# Patient Record
Sex: Female | Born: 1965 | Race: White | Hispanic: Yes | Marital: Married | Smoking: Never smoker
Health system: Southern US, Community
[De-identification: ages and names within clinical notes are randomized; demographics above are authoritative.]

## PROBLEM LIST (undated history)

## (undated) DIAGNOSIS — M25519 Pain in unspecified shoulder: Secondary | ICD-10-CM

## (undated) DIAGNOSIS — E119 Type 2 diabetes mellitus without complications: Secondary | ICD-10-CM

## (undated) DIAGNOSIS — F419 Anxiety disorder, unspecified: Secondary | ICD-10-CM

## (undated) DIAGNOSIS — M79605 Pain in left leg: Secondary | ICD-10-CM

## (undated) HISTORY — PX: EYE MUSCLE SURGERY: SHX370

## (undated) HISTORY — PX: DILATION AND CURETTAGE OF UTERUS: SHX78

---

## 2004-12-20 ENCOUNTER — Ambulatory Visit (HOSPITAL_BASED_OUTPATIENT_CLINIC_OR_DEPARTMENT_OTHER): Admission: RE | Admit: 2004-12-20 | Discharge: 2004-12-20 | Payer: Self-pay | Admitting: Ophthalmology

## 2004-12-20 ENCOUNTER — Ambulatory Visit (HOSPITAL_COMMUNITY): Admission: RE | Admit: 2004-12-20 | Discharge: 2004-12-20 | Payer: Self-pay | Admitting: Ophthalmology

## 2016-09-10 ENCOUNTER — Ambulatory Visit: Payer: Self-pay | Attending: Oncology | Admitting: *Deleted

## 2016-09-10 ENCOUNTER — Encounter: Payer: Self-pay | Admitting: *Deleted

## 2016-09-10 ENCOUNTER — Ambulatory Visit
Admission: RE | Admit: 2016-09-10 | Discharge: 2016-09-10 | Disposition: A | Discharge status: Home / Self Care | Payer: Self-pay | Source: Ambulatory Visit | Attending: Oncology | Admitting: Oncology

## 2016-09-10 VITALS — BP 127/82 | HR 67 | Temp 92.3°F | Resp 18 | Ht 64.0 in | Wt 149.8 lb

## 2016-09-10 DIAGNOSIS — Z0000 Encounter for general adult medical examination without abnormal findings: Secondary | ICD-10-CM

## 2016-09-10 NOTE — Progress Notes (Signed)
Subjective:     Patient ID: Michelle May, female   DOB: [birth year], 51 y.o.   MRN: [id number]  HPI   Review of Systems     Objective:   Physical Exam  Pulmonary/Chest: Right breast exhibits no inverted nipple, no mass, no nipple discharge, no skin change and no tenderness. Left breast exhibits no inverted nipple, no mass, no nipple discharge, no skin change and no tenderness. Breasts are symmetrical.  Abdominal: There is no splenomegaly or hepatomegaly.    Genitourinary: No labial fusion. There is no rash, tenderness, lesion or injury on the right labia. There is no rash, tenderness, lesion or injury on the left labia. Uterus is not deviated, not enlarged and not tender. Cervix exhibits no motion tenderness, no discharge and no friability. Right adnexum displays no mass, no tenderness and no fullness. Left adnexum displays no mass, no tenderness and no fullness. No erythema, tenderness or bleeding in the vagina. No foreign body in the vagina. No signs of injury around the vagina. No vaginal discharge found.         Assessment:     51 year old Hispanic female presents to [facility] for clinical breast exam, pap and mammogram.  Aquilla Solian, the interpreter present during the interview and exam.  Clinical breast exam unremarkable.  Taught self breast awareness.  Specimen collected for pap smear without difficulty.  Patient has been screened for eligibility.  She does not have any insurance, Medicare or Medicaid.  She also meets financial eligibility.  Hand-out given on the Affordable Care Act.    Plan:     Screening mammogram ordered.  Specimen for pap sent to the lab.  Will follow up per [facility] protocol.

## 2016-09-10 NOTE — Patient Instructions (Signed)
Prueba del VPH (HPV Test) La prueba del virus del papiloma humano (VPH) se usa para detectar los tipos de infeccin por el VPH de alto riesgo. El VPH es un grupo de alrededor de 100 virus. Muchos de estos virus causan tumores dentro de los genitales, sobre ellos o a su alrededor. La mayora de los VPH provocan infecciones que suelen desaparecen sin tratamiento. Sin embargo, los tipos 6, 11, 16 y 18 del VPH se consideran de alto riesgo y pueden aumentar el riesgo de padecer cncer de cuello del tero o de ano si la infeccin no se trata. La prueba del VPH identifica las cadenas de ADN (genticas) de la infeccin por el VPH, por lo que tambin se denomina prueba de ADN para el VPH. Aunque el VPH se encuentra tanto en los hombres como en las mujeres, la prueba del VPH se usa solo para detectar un mayor riesgo de cncer en las mujeres:  Con una prueba de Papanicolaou anormal.  Despus del tratamiento de una prueba de Papanicolaou anormal.  Entre 30y 65aos.  Despus del tratamiento de una infeccin por el VPH de alto riesgo. La prueba del VPH se puede hacer al mismo tiempo que un examen plvico y una prueba de Papanicolaou en mujeres de ms de 30 aos. Tanto la prueba del VPH como la prueba de Papanicolaou requieren una muestra de clulas del cuello del tero. PREPARACIN PARA EL ESTUDIO  No se haga lavados vaginales ni se d un bao durante 24 a 48 horas antes de la prueba o como se lo haya indicado el mdico.  No tenga sexo durante 24 a 48 horas antes de la prueba o como se lo haya indicado el mdico.  Es posible que se le pida que reprograme la prueba si est menstruando.  Se le pedir que orine antes de la prueba. RESULTADOS Es su responsabilidad retirar el resultado del estudio. Consulte en el laboratorio o en el departamento en el que fue realizado el estudio cundo y cmo podr obtener los resultados. Hable con el mdico si tiene alguna pregunta sobre los resultados. El resultado ser  negativo o positivo. Significado de los resultados negativos de la prueba Un resultado negativo de la prueba del VPH significa que no se detect el VPH, y es muy probable que no tenga el virus. Significado de los resultados positivos de la prueba Un resultado positivo de la prueba del VPH indica que tiene el virus.  Si el resultado de la prueba muestra la presencia de alguna cadena del VPH de alto riesgo, puede tener mayor riesgo de padecer cncer de cuello del tero o de ano si la infeccin no se trata.  Si se encuentran cadenas del VPH de bajo riesgo, es poco probable que tenga un alto riesgo de padecer cncer. Hable con el mdico sobre los resultados. El mdico utilizar los resultados para realizar un diagnstico y determinar un plan de tratamiento adecuado para usted. Esta informacin no tiene como fin reemplazar el consejo del mdico. Asegrese de hacerle al mdico cualquier pregunta que tenga. Document Released: 09/11/2008 Document Revised: 06/16/2014 Document Reviewed: 10/11/2013 Elsevier Interactive Patient Education  2017 Elsevier Inc.  Gave patient hand-out, Women Staying Healthy, Active and Well from [facility], with education on breast health, pap smears, heart and colon health.  

## 2016-09-12 ENCOUNTER — Other Ambulatory Visit: Payer: Self-pay | Admitting: *Deleted

## 2016-09-12 ENCOUNTER — Inpatient Hospital Stay
Admission: RE | Admit: 2016-09-12 | Discharge: 2016-09-12 | Disposition: A | Discharge status: Home / Self Care | Payer: Self-pay | Source: Ambulatory Visit | Attending: *Deleted | Admitting: *Deleted

## 2016-09-12 DIAGNOSIS — Z1231 Encounter for screening mammogram for malignant neoplasm of breast: Secondary | ICD-10-CM

## 2016-09-15 LAB — PAP LB AND HPV HIGH-RISK
HPV, HIGH-RISK: NEGATIVE
PAP Smear Comment: 0

## 2016-09-18 ENCOUNTER — Encounter: Payer: Self-pay | Admitting: *Deleted

## 2016-09-18 NOTE — Progress Notes (Signed)
Letter mailed to inform patient of her normal mammogram and pap smear.  Next pap due in 5 years and mammogram in one year.  [facility] to [place].

## 2016-09-23 ENCOUNTER — Encounter: Payer: Self-pay | Admitting: *Deleted

## 2016-09-23 ENCOUNTER — Encounter: Payer: Self-pay | Attending: Internal Medicine | Admitting: *Deleted

## 2016-09-23 VITALS — BP 110/78 | Ht 64.0 in | Wt 150.4 lb

## 2016-09-23 DIAGNOSIS — Z713 Dietary counseling and surveillance: Secondary | ICD-10-CM | POA: Insufficient documentation

## 2016-09-23 DIAGNOSIS — E119 Type 2 diabetes mellitus without complications: Secondary | ICD-10-CM | POA: Insufficient documentation

## 2016-09-23 DIAGNOSIS — E1165 Type 2 diabetes mellitus with hyperglycemia: Secondary | ICD-10-CM

## 2016-09-23 NOTE — Progress Notes (Signed)
Diabetes Self-Management Education  Visit Type: First/Initial  Appt. Start Time: 1335 Appt. End Time: 1505  09/23/2016  Michelle May, identified by name and date of birth, is a 51 y.o. female with a diagnosis of Diabetes: Type 2.   ASSESSMENT  Blood pressure 110/78, height  (1.626 m), weight 150 lb 6.4 oz (68.2 kg). Body mass index is 25.82 kg/m.      Diabetes Self-Management Education - 09/23/16 1519      Visit Information   Visit Type First/Initial     Initial Visit   Diabetes Type Type 2   Are you currently following a meal plan? No  "lessened consumption of carbs"   Are you taking your medications as prescribed? Not on Medications   Date Diagnosed March 2018     Health Coping   How would you rate your overall health? Good     Psychosocial Assessment   Patient Belief/Attitude about Diabetes --  "It impacted me but I'm dealing with it.    Self-care barriers Low literacy;English as a second language   Self-management support Doctor's office;Family   Other persons present Interpreter   Patient Concerns Nutrition/Meal planning;Glycemic Control;Monitoring   Special Needs Simplified materials   Preferred Learning Style Auditory   Learning Readiness Ready   How often do you need to have someone help you when you read instructions, pamphlets, or other written materials from your doctor or pharmacy? 4 - Often   What is the last grade level you completed in school? 4th grade     Pre-Education Assessment   Patient understands the diabetes disease and treatment process. Needs Instruction   Patient understands incorporating nutritional management into lifestyle. Needs Instruction   Patient undertands incorporating physical activity into lifestyle. Needs Instruction   Patient understands using medications safely. Needs Instruction   Patient understands monitoring blood glucose, interpreting and using results Needs Instruction   Patient understands prevention,  detection, and treatment of acute complications. Needs Instruction   Patient understands prevention, detection, and treatment of chronic complications. Needs Instruction   Patient understands how to develop strategies to address psychosocial issues. Needs Instruction   Patient understands how to develop strategies to promote health/change behavior. Needs Instruction     Complications   Last HgB A1C per patient/outside source 9.7 %  08/15/16   How often do you check your blood sugar? 0 times/day (not testing)  Pt brought her [employer] meter to visit. Instructed on use. BG upon return demonstration was 225 mg/dL at 0:98 pm - pt drank coffee with sugar on way to visit.    Have you had a dilated eye exam in the past 12 months? Yes   Have you had a dental exam in the past 12 months? No   Are you checking your feet? Yes   How many days per week are you checking your feet? 7     Dietary Intake   Breakfast drinks coffee with sugar   Snack (morning) 4 tortillas with tomato, squash   Lunch tortilla with banana    Snack (afternoon) coffee with tortilla   Dinner steak, chicken, pork chop with rice, beans, tortilla   Snack (evening) sugar free jello   Beverage(s) juice, water, coffee with sugar, unsweetened tea     Exercise   Exercise Type ADL's     Patient Education   Previous Diabetes Education No   Disease state  Definition of diabetes, type 1 and 2, and the diagnosis of diabetes   Nutrition management  Role of diet in the treatment of diabetes and the relationship between the three main macronutrients and blood glucose level;Food label reading, portion sizes and measuring food.   Physical activity and exercise  Role of exercise on diabetes management, blood pressure control and cardiac health.   Monitoring Taught/evaluated SMBG meter.;Purpose and frequency of SMBG.;Taught/discussed recording of test results and interpretation of SMBG.;Identified appropriate SMBG and/or A1C goals.   Chronic  complications Relationship between chronic complications and blood glucose control   Psychosocial adjustment Identified and addressed patients feelings and concerns about diabetes     Individualized Goals (developed by patient)   Reducing Risk Improve blood sugars Prevent diabetes complications     Outcomes   Expected Outcomes Demonstrated interest in learning. Expect positive outcomes   Future DMSE 2 wks      Individualized Plan for Diabetes Self-Management Training:   Learning Objective:  Patient will have a greater understanding of diabetes self-management. Patient education plan is to attend individual and/or group sessions per assessed needs and concerns.   Plan:   Patient Instructions  Check blood sugars 2 x day before breakfast and 2 hrs after supper every day Exercise: Begin walking for   15 minutes  3 days a week and gradually increase to 30 minutes 5 x week Avoid sugar sweetened drinks (tea, coffee, juices) Eat 3 meals day, 2 snacks a day Space meals 4-6 hours apart Allow 2-3 hours between eating meals and snacks Complete 3 Day Food Record and bring to next appt Bring blood sugar records to the next appointment Return for appointment on:  Wednesday Oct 08, 2016 at 1:15 pm with Pam (dietitian)  Expected Outcomes:  Demonstrated interest in learning. Expect positive outcomes  Education material provided:  General Meal Planning Guidelines (Spanish) 3 Day Food Record (Spanish)  If problems or questions, patient to contact team via:  Sharion Settler, RN, CCM, CDE [phone]  Future DSME appointment: 2 wks  Oct 08, 2016 with dietitian

## 2016-09-23 NOTE — Patient Instructions (Signed)
Check blood sugars 2 x day before breakfast and 2 hrs after supper every day  Exercise: Begin walking for   15 minutes  3 days a week and gradually increase to 30 minutes 5 x week  Avoid sugar sweetened drinks (tea, coffee, juices)  Eat 3 meals day, 2 snacks a day Space meals 4-6 hours apart Allow 2-3 hours between eating meals and snacks Complete 3 Day Food Record and bring to next appt  Bring blood sugar records to the next appointment  Return for appointment on:  Wednesday Oct 08, 2016 at 1:15 pm with [facility] (dietitian)

## 2016-10-08 ENCOUNTER — Encounter: Payer: Self-pay | Admitting: Dietician

## 2016-10-08 ENCOUNTER — Encounter: Payer: Self-pay | Attending: Internal Medicine | Admitting: Dietician

## 2016-10-08 VITALS — Ht 64.0 in | Wt 150.6 lb

## 2016-10-08 DIAGNOSIS — Z713 Dietary counseling and surveillance: Secondary | ICD-10-CM | POA: Insufficient documentation

## 2016-10-08 DIAGNOSIS — E1165 Type 2 diabetes mellitus with hyperglycemia: Secondary | ICD-10-CM

## 2016-10-08 DIAGNOSIS — E119 Type 2 diabetes mellitus without complications: Secondary | ICD-10-CM | POA: Insufficient documentation

## 2016-10-08 NOTE — Progress Notes (Signed)
Diabetes Self-Management Education  Visit Type:  Comprehensive  Appt. Start Time: 1315 Appt. End Time: 1445  10/08/2016  [name], identified by name and date of birth, is a 51 y.o. female with a diagnosis of Diabetes:  .   ASSESSMENT  Height  (1.626 m), weight 150 lb 9.6 oz (68.3 kg). Body mass index is 25.85 kg/m.       Diabetes Self-Management Education - 10/08/16 1336      Complications   How often do you check your blood sugar? 1-2 times/day   Fasting Blood glucose range (mg/dL) >161  096-045W, one reading of 250   Postprandial Blood glucose range (mg/dL) 098-119;>147  patient reports some high readings due to cookout party   Have you had a dilated eye exam in the past 12 months? Yes  missed appointment 09/2016, will reschedule.   Have you had a dental exam in the past 12 months? No  reports loose teeth, has pulled out 4 teeth on her own, 5th tooth is now quite loose   Are you checking your feet? Yes  uses inserts and extra socks to cushion feet when walking; heels will hurt otherwise   How many days per week are you checking your feet? 7     Dietary Intake   Breakfast 2-3 meals and  6am 1c coffee and 1 tortilla. 9am   Breakfast = 4 tortillas with 1 egg, 1 slices sausage cheese, broccoli, coffee   Snack (morning) 2 flour tortillas and 2c. coffee   Lunch tuna with mayo, crackers and coffee; or 4 corn Homemade tortillas with eggs and coffee   Snack (afternoon) 2 tortillas with coffee   Dinner chicken, potato, tomatoes, 4-5 tortillas, cheese, coffee. Loves cheese, eats often   Snack (evening) lemon tea with crackers/ cookies   Beverage(s) 2 cups (8oz) daily, coffee2% milk     Exercise   Exercise Type Light (walking / raking leaves)  if weather is nice   How many days per week to you exercise? 3   How many minutes per day do you exercise? 30   Total minutes per week of exercise 90     Patient Education   Disease state  Definition of diabetes, type  1 and 2, and the diagnosis of diabetes;Factors that contribute to the development of diabetes   Nutrition management  Role of diet in the treatment of diabetes and the relationship between the three main macronutrients and blood glucose level;Reviewed blood glucose goals for pre and post meals and how to evaluate the patients' food intake on their blood glucose level.;Meal timing in regards to the patients' current diabetes medication.;Other (comment)  food groups and portions and effects on BGs   Physical activity and exercise  Role of exercise on diabetes management, blood pressure control and cardiac health.   Acute complications Taught treatment of hypoglycemia - the 15 rule.;Discussed and identified patients' treatment of hyperglycemia.      Learning Objective:  Patient will have a greater understanding of diabetes self-management. Patient education plan is to attend individual and/or group sessions per assessed needs and concerns.   Plan:  Patient will schedule dental appointment.  Patient will work to improve nutrient balance in meals. Patient will review Diabetes and You, and Carbohydrate Counting booklets (in [place]).  Patient will study food lists provided.    Expected Outcomes:  Demonstrated interest in learning. Expect positive outcomes  Education material provided: Spanish diabetes class 1 slides; Planning a Balanced Meal (Spanish); Diabetes and  You, and Carbohydrate COunting (Spanish--Novo)   If problems or questions, patient to contact team via:  Phone via interpreter  Future DSME appointment: - 2 wks

## 2016-10-08 NOTE — Patient Instructions (Signed)
   Patient will schedule dental appointment.   Patient will work to improve nutrient balance in meals.  Patient will review Diabetes and You, and Carbohydrate Counting booklets (in [place]).   Patient will study food lists provided.

## 2016-10-22 ENCOUNTER — Encounter: Payer: Self-pay | Admitting: *Deleted

## 2016-10-22 VITALS — Wt 151.5 lb

## 2016-10-22 DIAGNOSIS — E1165 Type 2 diabetes mellitus with hyperglycemia: Secondary | ICD-10-CM

## 2016-10-22 NOTE — Progress Notes (Signed)
Diabetes Self-Management Education  Visit Type:  Comprehensive  Appt. Start Time: 0940 Appt. End Time: 1035  10/22/2016  [name], identified by name and date of birth, is a 51 y.o. female with a diagnosis of Diabetes:  .   ASSESSMENT  Weight 151 lb 8 oz (68.7 kg). Body mass index is 26 kg/m.       Diabetes Self-Management Education - 10/22/16 1200      Complications   How often do you check your blood sugar? 1-2 times/day  no records - reports her puppy ate her sheet. Gave new one.    Fasting Blood glucose range (mg/dL) --  Pt reports she can't get enough blood in the morning for FBG   Postprandial Blood glucose range (mg/dL) 161-096;045-409;>811  She reports pp's supper 145-280 mg/dL.    Have you had a dilated eye exam in the past 12 months? Yes  next appt for July   Have you had a dental exam in the past 12 months? No  encouraged again to make dental appt - is pulling loose ones   Are you checking your feet? Yes   How many days per week are you checking your feet? 7     Dietary Intake   Breakfast 3 meals and 2 snacks/day  she reports that she has decreased tortillas from 7 to 3 per day   Beverage(s) water, coffee with sugar  she reports decreasing sugar in coffee from 4 tsp to 2 tsp     Exercise   Exercise Type Light (walking / raking leaves)  walking the dog   How many days per week to you exercise? 3   How many minutes per day do you exercise? 60   Total minutes per week of exercise 180     Patient Education   Disease state  Definition of diabetes, type 1 and 2, and the diagnosis of diabetes   Nutrition management  Role of diet in the treatment of diabetes and the relationship between the three main macronutrients and blood glucose level;Reviewed blood glucose goals for pre and post meals and how to evaluate the patients' food intake on their blood glucose level.   Physical activity and exercise  Role of exercise on diabetes management, blood  pressure control and cardiac health.   Medications Reviewed patients medication for diabetes, action, purpose, timing of dose and side effects.   Monitoring Purpose and frequency of SMBG.;Taught/discussed recording of test results and interpretation of SMBG.;Identified appropriate SMBG and/or A1C goals.  Discussed ways to "prepare" finger for morning BG testing   Acute complications Taught treatment of hypoglycemia - the 15 rule.   Chronic complications Relationship between chronic complications and blood glucose control     Individualized Goals (developed by patient)   Nutrition Follow meal plan discussed   Physical Activity Exercise 3-5 times per week;30 minutes per day   Monitoring  test my blood glucose as discussed;Other (comment)  bring BG record to next visit      Learning Objective:  Patient will have a greater understanding of diabetes self-management. Patient education plan is to attend individual and/or group sessions per assessed needs and concerns.   Plan:   Patient Instructions  Check blood sugars 2 x day before breakfast and 2 hrs after supper every day Bring blood sugar records to the next appointment Exercise: Continue walking  for   60  minutes   3  days a week Eat 3 meals day,  2 snacks a day Space  meals 4-6 hours apart Avoid sugar sweetened drinks (coffee) Make a dentist appointment  Expected Outcomes:  Demonstrated interest in learning. Expect positive outcomes  Education material provided:  BG record Understanding Type 2 Diabetes - Krames (Spanish) Hypoglycemia - Krames (Spanish) Hyperglycemia - Krames (Spanish) Taking Medication for Diabetes - Krames (Spanish) Diabetes: The Benefits of Exercise - Krames (Spanish) Managing Diabetes: The A1C Test - Krames (Spanish) Long Term Complications of Diabetes - Krames (Spanish)  If problems or questions, patient to contact team via:  Sharion SettlerSheila Dominik Lauricella, RN, CCM, CDE [phone]  Future DSME appointment: - 2 wks   Nov 05, 2016 with the dietitian

## 2016-10-22 NOTE — Patient Instructions (Addendum)
Check blood sugars 2 x day before breakfast and 2 hrs after supper every day  Bring blood sugar records to the next appointment  Exercise: Continue walking  for   60  minutes   3  days a week  Eat 3 meals day,  2 snacks a day Space meals 4-6 hours apart  Avoid sugar sweetened drinks (coffee)  Make a dentist appointment

## 2016-11-05 ENCOUNTER — Encounter: Payer: Self-pay | Admitting: Dietician

## 2016-11-05 VITALS — BP 108/70 | Ht 64.0 in | Wt 149.9 lb

## 2016-11-05 DIAGNOSIS — E1165 Type 2 diabetes mellitus with hyperglycemia: Secondary | ICD-10-CM

## 2016-11-05 NOTE — Progress Notes (Signed)
Diabetes Self-Management Education  Visit Type:  Comprehensive  Appt. Start Time: 0930 Appt. End Time: 1040  11/05/2016  [name], identified by name and date of birth, is a 51 y.o. female with a diagnosis of Diabetes:  .   ASSESSMENT  Blood pressure 108/70, height 5\' 4"  (1.626 m), weight 149 lb 14.4 oz (68 kg). Body mass index is 25.73 kg/m.       Diabetes Self-Management Education - 11/05/16 1600      Complications   How often do you check your blood sugar? 1-2 times/day   Fasting Blood glucose range (mg/dL) 52-841;324-401;>02770-129;130-179;>200   Postprandial Blood glucose range (mg/dL) 25-366;440-347;>42570-129;180-200;>200  [id number] after lunch; 180-280 after supper   Have you had a dilated eye exam in the past 12 months? Yes  has appointment 12/2016   Have you had a dental exam in the past 12 months? No  has appointment 11/2016   Are you checking your feet? Yes   How many days per week are you checking your feet? 7     Dietary Intake   Breakfast 3 meals and 2 snacks daily   Lunch increased vegetables and fewer fried foods     Exercise   Exercise Type Light (walking / raking leaves)   How many days per week to you exercise? 3   How many minutes per day do you exercise? 60   Total minutes per week of exercise 180     Patient Education   Nutrition management  Food label reading, portion sizes and measuring food.;Information on hints to eating out and maintain blood glucose control.;Meal options for control of blood glucose level and chronic complications.   Monitoring Taught/discussed recording of test results and interpretation of SMBG.   Personal strategies to promote health Other (comment)  set goals for next 6 months.     Post-Education Assessment   Patient understands the diabetes disease and treatment process. Demonstrates understanding / competency   Patient understands incorporating nutritional management into lifestyle. Demonstrates understanding / competency   Patient undertands  incorporating physical activity into lifestyle. Demonstrates understanding / competency   Patient understands using medications safely. Needs Review  no medications at this time   Patient understands monitoring blood glucose, interpreting and using results Demonstrates understanding / competency   Patient understands prevention, detection, and treatment of acute complications. Demonstrates understanding / competency   Patient understands prevention, detection, and treatment of chronic complications. Demonstrates understanding / competency   Patient understands how to develop strategies to address psychosocial issues. Demonstrates understanding / competency   Patient understands how to develop strategies to promote health/change behavior. Demonstrates understanding / competency     Outcomes   Program Status Completed      Learning Objective:  Patient will have a greater understanding of diabetes self-management. Patient education plan is to attend individual and/or group sessions per assessed needs and concerns.  [name] has made multiple positive lifestyle changes and has seen improvement in BG control. She does continue to have elevated BG readings, particularly after supper, due to larger food portions, but will work on further improving that meal.    Plan:   Follow goals set to Increase intake of high fiber foods especially vegetables and fruits, and continue to work on controlling food portions at supper.    Expected Outcomes:  Demonstrated interest in learning. Expect positive outcomes  Education material provided: nutrition slide handouts in Spanish   If problems or questions, patient to contact team via:  Phone  Future DSME appointment: -

## 2017-01-03 ENCOUNTER — Emergency Department
Admission: EM | Admit: 2017-01-03 | Discharge: 2017-01-03 | Disposition: A | Discharge status: Home / Self Care | Payer: Self-pay | Attending: Emergency Medicine | Admitting: Emergency Medicine

## 2017-01-03 DIAGNOSIS — R600 Localized edema: Secondary | ICD-10-CM | POA: Insufficient documentation

## 2017-01-03 DIAGNOSIS — Z5321 Procedure and treatment not carried out due to patient leaving prior to being seen by health care provider: Secondary | ICD-10-CM | POA: Insufficient documentation

## 2017-01-03 DIAGNOSIS — M79644 Pain in right finger(s): Secondary | ICD-10-CM | POA: Insufficient documentation

## 2017-01-03 NOTE — ED Triage Notes (Signed)
Information obtained using [employer] interpreter services [place] [phone].  Two weeks ago got splinter into right hand 3rd digit.  Thought she had gotten all of splinter out, but reports continued pain and swelling.

## 2017-10-08 ENCOUNTER — Other Ambulatory Visit: Payer: Self-pay | Admitting: Internal Medicine

## 2017-10-08 DIAGNOSIS — Z87828 Personal history of other (healed) physical injury and trauma: Secondary | ICD-10-CM

## 2017-10-12 ENCOUNTER — Ambulatory Visit
Admission: RE | Admit: 2017-10-12 | Discharge: 2017-10-12 | Disposition: A | Discharge status: Home / Self Care | Payer: Self-pay | Source: Ambulatory Visit | Attending: Internal Medicine | Admitting: Internal Medicine

## 2017-10-12 DIAGNOSIS — M25511 Pain in right shoulder: Secondary | ICD-10-CM | POA: Insufficient documentation

## 2017-10-12 DIAGNOSIS — Z87828 Personal history of other (healed) physical injury and trauma: Secondary | ICD-10-CM | POA: Insufficient documentation

## 2018-03-17 ENCOUNTER — Other Ambulatory Visit: Payer: Self-pay | Admitting: Internal Medicine

## 2018-03-17 DIAGNOSIS — Z1231 Encounter for screening mammogram for malignant neoplasm of breast: Secondary | ICD-10-CM

## 2018-04-26 ENCOUNTER — Encounter
Admission: RE | Admit: 2018-04-26 | Discharge: 2018-04-26 | Disposition: A | Discharge status: Home / Self Care | Payer: Self-pay | Source: Ambulatory Visit | Attending: Surgery | Admitting: Surgery

## 2018-04-26 ENCOUNTER — Other Ambulatory Visit: Payer: Self-pay

## 2018-04-26 DIAGNOSIS — Z01818 Encounter for other preprocedural examination: Secondary | ICD-10-CM | POA: Insufficient documentation

## 2018-04-26 DIAGNOSIS — R001 Bradycardia, unspecified: Secondary | ICD-10-CM | POA: Insufficient documentation

## 2018-04-26 DIAGNOSIS — E119 Type 2 diabetes mellitus without complications: Secondary | ICD-10-CM | POA: Insufficient documentation

## 2018-04-26 HISTORY — DX: Anxiety disorder, unspecified: F41.9

## 2018-04-26 HISTORY — DX: Pain in unspecified shoulder: M25.519

## 2018-04-26 HISTORY — DX: Type 2 diabetes mellitus without complications: E11.9

## 2018-04-26 HISTORY — DX: Pain in left leg: M79.605

## 2018-04-26 LAB — BASIC METABOLIC PANEL
Anion gap: 7 (ref 5–15)
BUN: 7 mg/dL (ref 6–20)
CALCIUM: 9 mg/dL (ref 8.9–10.3)
CHLORIDE: 101 mmol/L (ref 98–111)
CO2: 27 mmol/L (ref 22–32)
CREATININE: 0.41 mg/dL — AB (ref 0.44–1.00)
GFR calc Af Amer: >60 mL/min (ref >60)
GFR calc non Af Amer: >60 mL/min (ref >60)
Glucose, Bld: 230 mg/dL — ABNORMAL HIGH (ref 70–99)
Potassium: 3.8 mmol/L (ref 3.5–5.1)
Sodium: 135 mmol/L (ref 135–145)

## 2018-04-26 NOTE — Patient Instructions (Signed)
Your procedure is scheduled on: 04/29/18 Cl[place] Cao Su procedimiento est programado para: Report to 2nd floor medical mallPresntese a: To find out your arrival time please call [phone] between 1PM - 3PM on 04/28/18 Wed. Para saber su hora de llegada por favor llame al [phone] entre la 1PM - 3PM el da:   Remember: Instructions that are not followed completely may result in serious medical risk, up to and including death,  or upon the discretion of your surgeon and anesthesiologist your surgery may need to be rescheduled.  Recuerde: Las instrucciones que no se siguen completamente Armed forces logistics/support/administrative officer en un riesgo de salud grave, incluyendo hasta  la [place] o a discrecin de su cirujano y Scientific laboratory technician, su ciruga se puede posponer.   __X_ 1.Do not eat food after midnight the night before your procedure. No    gum chewing or hard candies. You may drink clear liquids up to 2 hours     before you are scheduled to arrive for your surgery- DO not drink clear     Liquids within 2 hours of the start of your surgery.     Clear Liquids include:    water, apple juice without pulp, clear carbohydrate drink such as    Clearfast of Gartorade, Black Coffee or Tea (Do not add anything to coffee or tea).      No coma nada despus de la medianoche de la noche anterior a su    procedimiento. No coma chicles ni caramelos duros. Puede tomar    lquidos claros hasta 2 horas antes de su hora programada de llegada al     hospital para su procedimiento. No tome lquidos claros durante el     transcurso de las 2 horas de su llegada programada al hospital para su     procedimiento, ya que esto puede llevar a que su procedimiento se    retrase o tenga que volver a Magazine features editor.  Los lquidos claros incluyen:          - Agua o jugo de [place] sin pulpa          - Bebidas claras con carbohidratos como ClearFast o Gatorade          - Caf negro o t claro (sin leche, sin cremas, no agregue nada al caf ni al  t)  No tome nada que no est en esta lista.  Los pacientes con diabetes tipo 1 y tipo 2 solo deben Printmaker.  Llame a la clnica de PreCare o a la unidad de Same Day Surgery si  tiene alguna pregunta sobre estas instrucciones.              _X__ 2.Do Not Smoke or use e-cigarettes For 24 Hours Prior to Your Surgery.    Do not use any chewable tobacco products for at least 6   hours prior to surgery.    No fume ni use cigarrillos electrnicos durante las 24 horas previas    a su [place].  No use ningn producto de tabaco masticable durante   al menos 6 horas antes de la [place].     __X_ 3. No alcohol for 24 hours before or after surgery.    No tome alcohol durante las 24 horas antes ni despus de la [place].   ____4. Bring all medications with you on the day of surgery if instructed.    Lleve todos los medicamentos con usted el da de su ciruga si se le    ha indicado as.  ____ 5. Notify your doctor if there is any change in your medical condition (cold,fever, infections).    Informe a su mdico si hay algn cambio en su condicin mdica  (resfriado, fiebre, infecciones).   Do not wear jewelry, make-up, hairpins, clips or nail polish.  No use joyas, maquillajes, pinzas/ganchos para el cabello ni esmalte de uas.  Do not wear lotions, powders, or perfumes. You may wear deodorant.  No use lociones, polvos o perfumes.  Puede usar desodorante.    Do not shave 48 hours prior to surgery. Men may shave face and neck.  No se afeite 48 horas antes de la [place].  Los hombres pueden Commercial Metals Companyafeitarse la cara  y el cuello.   Do not bring valuables to the hospital.   No lleve objetos de valor al hospital.  [facility] is not responsible for any belongings or valuables.  [place] no se hace responsable de ningn tipo de pertenencias u objetos de Licensed conveyancervalor.               Contacts, dentures or bridgework may not be worn into surgery.  Los lentes de Argoniacontacto, las dentaduras postizas o puentes no se  pueden usar en la [place].   Leave your suitcase in the car. After surgery it may be brought to your room.  Deje su maleta en el auto.  Despus de la ciruga podr traerla a su habitacin.   For patients admitted to the hospital, discharge time is determined by your  treatment team.  Para los pacientes que sean ingresados al hospital, el tiempo en el cual se le  dar de alta es determinado por su equipo de Little Meadowstratamiento.   Patients discharged the day of surgery will not be allowed to drive home. A los pacientes que se les da de alta el mismo da de la ciruga no se les permitir conducir a Higher education careers advisercasa.   Please read over the following fact sheets that you were given: Por favor lea las siguientes hojas de informacin que le dieron:    ____ Take these medicines the morning of surgery with A SIP OF WATER:          [place]ens-Illinoisome estas medicinas la maana de la ciruga con UN SORBO DE AGUA:  1.None  2.   3.   4.       5.  6.  ____ Fleet Enema (as directed)          Enema de Fleet (segn lo indicado)    ____ Use CHG Soap as directed          Utilice el jabn de CHG segn lo indicado  ____ Use inhalers on the day of surgery          Use los inhaladores el da de la ciruga  _x___ Stop metformin 2 days prior to surgery          Deje de tomar el metformin 2 das antes de la ciruga    ____ Take 1/2 of usual insulin dose the night before surgery and none on the morning of surgery           Tome la mitad de la dosis habitual de insulina la noche antes de la [place] y no tome nada en la maana de la             ciruga  ____ Stop Coumadin/Plavix/aspirin on           Deje de tomar el Coumadin/Plavix/aspirina el da:  ____ Stop Anti-inflammatories  on           Deje de tomar [employer]:   ____ Stop supplements until after surgery            Deje de tomar suplementos hasta despus de la ciruga  ____ Bring C-Pap to the hospital          [address] al hospital

## 2018-04-29 ENCOUNTER — Encounter
Admission: RE | Disposition: A | Discharge status: Home / Self Care | Payer: Self-pay | Source: Ambulatory Visit | Attending: Surgery

## 2018-04-29 ENCOUNTER — Ambulatory Visit: Payer: No Typology Code available for payment source | Admitting: Anesthesiology

## 2018-04-29 ENCOUNTER — Ambulatory Visit
Admission: RE | Admit: 2018-04-29 | Discharge: 2018-04-29 | Disposition: A | Discharge status: Home / Self Care | Payer: No Typology Code available for payment source | Source: Ambulatory Visit | Attending: Surgery | Admitting: Surgery

## 2018-04-29 ENCOUNTER — Other Ambulatory Visit: Payer: Self-pay

## 2018-04-29 DIAGNOSIS — S46011A Strain of muscle(s) and tendon(s) of the rotator cuff of right shoulder, initial encounter: Secondary | ICD-10-CM | POA: Insufficient documentation

## 2018-04-29 DIAGNOSIS — E119 Type 2 diabetes mellitus without complications: Secondary | ICD-10-CM | POA: Diagnosis not present

## 2018-04-29 DIAGNOSIS — Z7984 Long term (current) use of oral hypoglycemic drugs: Secondary | ICD-10-CM | POA: Insufficient documentation

## 2018-04-29 DIAGNOSIS — M7541 Impingement syndrome of right shoulder: Secondary | ICD-10-CM | POA: Insufficient documentation

## 2018-04-29 HISTORY — PX: SHOULDER ARTHROSCOPY WITH OPEN ROTATOR CUFF REPAIR: SHX6092

## 2018-04-29 LAB — GLUCOSE, CAPILLARY
GLUCOSE-CAPILLARY: 246 mg/dL — AB (ref 70–99)
Glucose-Capillary: 189 mg/dL — ABNORMAL HIGH (ref 70–99)

## 2018-04-29 LAB — POCT PREGNANCY, URINE: PREG TEST UR: NEGATIVE

## 2018-04-29 SURGERY — ARTHROSCOPY, SHOULDER WITH REPAIR, ROTATOR CUFF, OPEN
Anesthesia: General | Site: Shoulder | Laterality: Right

## 2018-04-29 MED ORDER — DEXAMETHASONE SODIUM PHOSPHATE 10 MG/ML IJ SOLN
INTRAMUSCULAR | Status: DC | PRN
  Administered 2018-04-29: 5 mg via INTRAVENOUS

## 2018-04-29 MED ORDER — FENTANYL CITRATE (PF) 100 MCG/2ML IJ SOLN
50.0000 ug | INTRAMUSCULAR | Status: DC | PRN
  Administered 2018-04-29: 50 ug via INTRAVENOUS

## 2018-04-29 MED ORDER — PROMETHAZINE HCL 25 MG/ML IJ SOLN: 6.2500 mg | INTRAMUSCULAR | Status: DC | PRN

## 2018-04-29 MED ORDER — LIDOCAINE HCL (PF) 1 % IJ SOLN
INTRAMUSCULAR | Status: AC
  Filled 2018-04-29: qty 5

## 2018-04-29 MED ORDER — PROPOFOL 10 MG/ML IV BOLUS
INTRAVENOUS | Status: AC
  Filled 2018-04-29: qty 20

## 2018-04-29 MED ORDER — BUPIVACAINE HCL (PF) 0.5 % IJ SOLN
INTRAMUSCULAR | Status: DC | PRN
  Administered 2018-04-29: 10 mL via PERINEURAL

## 2018-04-29 MED ORDER — LIDOCAINE HCL (PF) 1 % IJ SOLN
INTRAMUSCULAR | Status: DC | PRN
  Administered 2018-04-29: 3 mL

## 2018-04-29 MED ORDER — BUPIVACAINE-EPINEPHRINE 0.5% -1:200000 IJ SOLN
INTRAMUSCULAR | Status: DC | PRN
  Administered 2018-04-29: 30 mL

## 2018-04-29 MED ORDER — BUPIVACAINE HCL (PF) 0.5 % IJ SOLN
INTRAMUSCULAR | Status: AC
  Filled 2018-04-29: qty 10

## 2018-04-29 MED ORDER — EPINEPHRINE PF 1 MG/ML IJ SOLN
INTRAMUSCULAR | Status: AC
  Filled 2018-04-29: qty 2

## 2018-04-29 MED ORDER — FENTANYL CITRATE (PF) 100 MCG/2ML IJ SOLN
INTRAMUSCULAR | Status: AC
  Administered 2018-04-29: 50 ug via INTRAVENOUS
  Filled 2018-04-29: qty 2

## 2018-04-29 MED ORDER — OXYCODONE HCL 5 MG PO TABS: 5.0000 mg | ORAL_TABLET | Freq: Once | ORAL | Status: DC | PRN

## 2018-04-29 MED ORDER — OXYCODONE HCL 5 MG PO TABS: 5.0000 mg | ORAL_TABLET | ORAL | 0 refills | Status: DC | PRN

## 2018-04-29 MED ORDER — LACTATED RINGERS IV SOLN
INTRAVENOUS | Status: DC | PRN
  Administered 2018-04-29: 09:00:00 via INTRAVENOUS

## 2018-04-29 MED ORDER — MEPERIDINE HCL 50 MG/ML IJ SOLN: 6.2500 mg | INTRAMUSCULAR | Status: DC | PRN

## 2018-04-29 MED ORDER — FENTANYL CITRATE (PF) 100 MCG/2ML IJ SOLN
INTRAMUSCULAR | Status: DC | PRN
  Administered 2018-04-29 (×2): 50 ug via INTRAVENOUS

## 2018-04-29 MED ORDER — CEFAZOLIN SODIUM-DEXTROSE 2-4 GM/100ML-% IV SOLN
INTRAVENOUS | Status: AC
  Filled 2018-04-29: qty 100

## 2018-04-29 MED ORDER — PROPOFOL 10 MG/ML IV BOLUS
INTRAVENOUS | Status: DC | PRN
  Administered 2018-04-29: 120 mg via INTRAVENOUS

## 2018-04-29 MED ORDER — SUGAMMADEX SODIUM 200 MG/2ML IV SOLN
INTRAVENOUS | Status: DC | PRN
  Administered 2018-04-29: 150 mg via INTRAVENOUS

## 2018-04-29 MED ORDER — FENTANYL CITRATE (PF) 100 MCG/2ML IJ SOLN
INTRAMUSCULAR | Status: AC
  Filled 2018-04-29: qty 2

## 2018-04-29 MED ORDER — MIDAZOLAM HCL 2 MG/2ML IJ SOLN
INTRAMUSCULAR | Status: DC | PRN
  Administered 2018-04-29: 2 mg via INTRAVENOUS

## 2018-04-29 MED ORDER — CEFAZOLIN SODIUM-DEXTROSE 2-4 GM/100ML-% IV SOLN
2.0000 g | Freq: Once | INTRAVENOUS | Status: AC
Start: 2018-04-29 — End: 2018-04-29
  Administered 2018-04-29: 2 g via INTRAVENOUS

## 2018-04-29 MED ORDER — SODIUM CHLORIDE 0.9 % IV SOLN
INTRAVENOUS | Status: DC
  Administered 2018-04-29: 10:00:00 via INTRAVENOUS

## 2018-04-29 MED ORDER — OXYCODONE HCL 5 MG/5ML PO SOLN: 5.0000 mg | Freq: Once | ORAL | Status: DC | PRN

## 2018-04-29 MED ORDER — MIDAZOLAM HCL 2 MG/2ML IJ SOLN
INTRAMUSCULAR | Status: AC
  Administered 2018-04-29: 1 mg via INTRAVENOUS
  Filled 2018-04-29: qty 2

## 2018-04-29 MED ORDER — BUPIVACAINE LIPOSOME 1.3 % IJ SUSP
INTRAMUSCULAR | Status: DC | PRN
  Administered 2018-04-29: 20 mL via PERINEURAL

## 2018-04-29 MED ORDER — MIDAZOLAM HCL 2 MG/2ML IJ SOLN
1.0000 mg | INTRAMUSCULAR | Status: DC | PRN
  Administered 2018-04-29: 1 mg via INTRAVENOUS

## 2018-04-29 MED ORDER — LIDOCAINE HCL (CARDIAC) PF 100 MG/5ML IV SOSY
PREFILLED_SYRINGE | INTRAVENOUS | Status: DC | PRN
  Administered 2018-04-29: 60 mg via INTRAVENOUS

## 2018-04-29 MED ORDER — BUPIVACAINE-EPINEPHRINE (PF) 0.5% -1:200000 IJ SOLN
INTRAMUSCULAR | Status: AC
  Filled 2018-04-29: qty 30

## 2018-04-29 MED ORDER — FAMOTIDINE 20 MG PO TABS
ORAL_TABLET | ORAL | Status: AC
  Administered 2018-04-29: 20 mg via ORAL
  Filled 2018-04-29: qty 1

## 2018-04-29 MED ORDER — FENTANYL CITRATE (PF) 100 MCG/2ML IJ SOLN: 25.0000 ug | INTRAMUSCULAR | Status: DC | PRN

## 2018-04-29 MED ORDER — MIDAZOLAM HCL 2 MG/2ML IJ SOLN
INTRAMUSCULAR | Status: AC
  Filled 2018-04-29: qty 2

## 2018-04-29 MED ORDER — PHENYLEPHRINE HCL 10 MG/ML IJ SOLN
INTRAMUSCULAR | Status: DC | PRN
  Administered 2018-04-29 (×2): 100 ug via INTRAVENOUS
  Administered 2018-04-29: 50 ug via INTRAVENOUS
  Administered 2018-04-29: 100 ug via INTRAVENOUS
  Administered 2018-04-29: 50 ug via INTRAVENOUS
  Administered 2018-04-29: 100 ug via INTRAVENOUS

## 2018-04-29 MED ORDER — FAMOTIDINE 20 MG PO TABS
20.0000 mg | ORAL_TABLET | Freq: Once | ORAL | Status: AC
  Administered 2018-04-29: 20 mg via ORAL

## 2018-04-29 MED ORDER — ROCURONIUM BROMIDE 100 MG/10ML IV SOLN
INTRAVENOUS | Status: DC | PRN
  Administered 2018-04-29: 50 mg via INTRAVENOUS

## 2018-04-29 MED ORDER — ONDANSETRON HCL 4 MG/2ML IJ SOLN
INTRAMUSCULAR | Status: DC | PRN
  Administered 2018-04-29: 4 mg via INTRAVENOUS

## 2018-04-29 MED ORDER — BUPIVACAINE LIPOSOME 1.3 % IJ SUSP
INTRAMUSCULAR | Status: AC
  Filled 2018-04-29: qty 20

## 2018-04-29 MED ORDER — LACTATED RINGERS IV SOLN
INTRAVENOUS | Status: DC | PRN
  Administered 2018-04-29: 2 mL

## 2018-04-29 SURGICAL SUPPLY — 44 items
ANCH SUT 2 2.9 2 LD TPR NDL (Anchor) ×1 IMPLANT
ANCHOR JUGGERKNOT WTAP NDL 2.9 (Anchor) ×3 IMPLANT
BIT DRILL JUGRKNT W/NDL BIT2.9 (DRILL) ×1 IMPLANT
BLADE FULL RADIUS 3.5 (BLADE) ×3 IMPLANT
BUR ACROMIONIZER 4.0 (BURR) ×3 IMPLANT
CANNULA SHAVER 8MMX76MM (CANNULA) ×3 IMPLANT
CHLORAPREP W/TINT 26ML (MISCELLANEOUS) ×3 IMPLANT
COVER MAYO STAND STRL (DRAPES) ×3 IMPLANT
COVER WAND RF STERILE (DRAPES) IMPLANT
DRAPE IMP U-DRAPE 54X76 (DRAPES) ×6 IMPLANT
DRILL JUGGERKNOT W/NDL BIT 2.9 (DRILL) ×3
ELECT REM PT RETURN 9FT ADLT (ELECTROSURGICAL) ×3
ELECTRODE REM PT RTRN 9FT ADLT (ELECTROSURGICAL) ×1 IMPLANT
GAUZE PETRO XEROFOAM 1X8 (MISCELLANEOUS) ×3 IMPLANT
GAUZE SPONGE 4X4 12PLY STRL (GAUZE/BANDAGES/DRESSINGS) ×3 IMPLANT
GLOVE BIO SURGEON STRL SZ7.5 (GLOVE) ×6 IMPLANT
GLOVE BIO SURGEON STRL SZ8 (GLOVE) ×6 IMPLANT
GLOVE BIOGEL PI IND STRL 8 (GLOVE) ×1 IMPLANT
GLOVE BIOGEL PI INDICATOR 8 (GLOVE) ×2
GLOVE INDICATOR 8.0 STRL GRN (GLOVE) ×3 IMPLANT
GOWN STRL REUS W/ TWL LRG LVL3 (GOWN DISPOSABLE) ×1 IMPLANT
GOWN STRL REUS W/ TWL XL LVL3 (GOWN DISPOSABLE) ×1 IMPLANT
GOWN STRL REUS W/TWL LRG LVL3 (GOWN DISPOSABLE) ×2
GOWN STRL REUS W/TWL XL LVL3 (GOWN DISPOSABLE) ×2
GRASPER SUT 15 45D LOW PRO (SUTURE) IMPLANT
IV LACTATED RINGER IRRG 3000ML (IV SOLUTION) ×6
IV LR IRRIG 3000ML ARTHROMATIC (IV SOLUTION) ×2 IMPLANT
MANIFOLD NEPTUNE II (INSTRUMENTS) ×3 IMPLANT
MASK FACE SPIDER DISP (MASK) ×3 IMPLANT
MAT ABSORB  FLUID 56X50 GRAY (MISCELLANEOUS) ×2
MAT ABSORB FLUID 56X50 GRAY (MISCELLANEOUS) ×1 IMPLANT
PACK ARTHROSCOPY SHOULDER (MISCELLANEOUS) ×3 IMPLANT
SLING ARM LRG DEEP (SOFTGOODS) IMPLANT
SLING ULTRA II LG (MISCELLANEOUS) ×3 IMPLANT
STAPLER SKIN PROX 35W (STAPLE) ×3 IMPLANT
STRAP SAFETY 5IN WIDE (MISCELLANEOUS) ×3 IMPLANT
SUT ETHIBOND 0 MO6 C/R (SUTURE) ×3 IMPLANT
SUT VIC AB 2-0 CT1 27 (SUTURE) ×6
SUT VIC AB 2-0 CT1 TAPERPNT 27 (SUTURE) ×2 IMPLANT
TAPE MICROFOAM 4IN (TAPE) ×3 IMPLANT
TUBING ARTHRO INFLOW-ONLY STRL (TUBING) ×3 IMPLANT
TUBING CONNECTING 10 (TUBING) ×2 IMPLANT
TUBING CONNECTING 10' (TUBING) ×1
WAND HAND CNTRL MULTIVAC 90 (MISCELLANEOUS) ×3 IMPLANT

## 2018-04-29 NOTE — Anesthesia Post-op Follow-up Note (Signed)
Anesthesia QCDR form completed.        

## 2018-04-29 NOTE — Anesthesia Preprocedure Evaluation (Signed)
Anesthesia Evaluation  Patient identified by MRN, date of birth, ID band Patient awake    Reviewed: Allergy & Precautions, NPO status , Patient's Chart, lab work & pertinent test results  History of Anesthesia Complications Negative for: history of anesthetic complications  Airway Mallampati: II  TM Distance: >3 FB Neck ROM: Full    Dental  (+) Poor Dentition, Missing, Loose   Pulmonary neg pulmonary ROS, neg sleep apnea, neg COPD,    breath sounds clear to auscultation- rhonchi (-) wheezing      Cardiovascular Exercise Tolerance: Good (-) hypertension(-) CAD, (-) Past MI, (-) Cardiac Stents and (-) CABG  Rhythm:Regular Rate:Normal - Systolic murmurs and - Diastolic murmurs    Neuro/Psych Anxiety negative neurological ROS     GI/Hepatic negative GI ROS, Neg liver ROS,   Endo/Other  diabetes, Oral Hypoglycemic Agents  Renal/GU negative Renal ROS     Musculoskeletal negative musculoskeletal ROS (+)   Abdominal (+) - obese,   Peds  Hematology negative hematology ROS (+)   Anesthesia Other Findings Past Medical History: No date: Anxiety No date: Diabetes mellitus without complication (HCC) No date: Pain in left leg No date: Shoulder pain   Reproductive/Obstetrics                             Anesthesia Physical Anesthesia Plan  ASA: II  Anesthesia Plan: General   Post-op Pain Management:  Regional for Post-op pain   Induction: Intravenous  PONV Risk Score and Plan: 2 and Ondansetron, Dexamethasone and Midazolam  Airway Management Planned: Oral ETT  Additional Equipment:   Intra-op Plan:   Post-operative Plan: Extubation in OR  Informed Consent: I have reviewed the patients History and Physical, chart, labs and discussed the procedure including the risks, benefits and alternatives for the proposed anesthesia with the patient or authorized representative who has indicated  his/her understanding and acceptance.   Dental advisory given  Plan Discussed with: CRNA and Anesthesiologist  Anesthesia Plan Comments:         Anesthesia Quick Evaluation

## 2018-04-29 NOTE — Anesthesia Procedure Notes (Signed)
Procedure Name: Intubation Performed by: Marcy Siren, CRNA Pre-anesthesia Checklist: Patient identified, Emergency Drugs available, Suction available, Patient being monitored and Timeout performed Patient Re-evaluated:Patient Re-evaluated prior to induction Preoxygenation: Pre-oxygenation with 100% oxygen Induction Type: IV induction Laryngoscope Size: Mac and 3 Grade View: Grade I Tube type: Oral Tube size: 7.0 mm Number of attempts: 1 Placement Confirmation: ETT inserted through vocal cords under direct vision,  positive ETCO2,  CO2 detector and breath sounds checked- equal and bilateral Secured at: 21 cm Tube secured with: Tape Dental Injury: Teeth and Oropharynx as per pre-operative assessment

## 2018-04-29 NOTE — Transfer of Care (Signed)
Immediate Anesthesia Transfer of Care Note  Patient: Michelle May  Procedure(s) Performed: SHOULDER ARTHROSCOPY WITH OPEN ROTATOR CUFF REPAIR (Right Shoulder)  Patient Location: PACU  Anesthesia Type:General  Level of Consciousness: alert   Airway & Oxygen Therapy: Patient Spontanous Breathing  Post-op Assessment: Report given to RN  Post vital signs: stable  Last Vitals:  Vitals Value Taken Time  BP    Temp    Pulse 92 04/29/2018  2:04 PM  Resp 17 04/29/2018  2:04 PM  SpO2 98 % 04/29/2018  2:04 PM  Vitals shown include unvalidated device data.  Last Pain:  Vitals:   04/29/18 1002  TempSrc:   PainSc: 0-No pain         Complications: No apparent anesthesia complications
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
## 2018-04-29 NOTE — Discharge Instructions (Addendum)
Orthopedic discharge instructions: Keep dressing dry and intact.  May shower after dressing changed on post-op day #4 (Monday).  Cover staples/sutures with Band-Aids after drying off. Apply ice frequently to shoulder. Take ibuprofen 800 mg TID with meals for 7-10 days, then as necessary. Take oxycodone as prescribed when needed.  May supplement with ES Tylenol if necessary. Keep shoulder immobilizer on at all times except may remove for bathing purposes. Follow-up in 10-14 days or as scheduled.  AMBULATORY SURGERY  DISCHARGE INSTRUCTIONS   1) The drugs that you were given will stay in your system until tomorrow so for the next 24 hours you should not:  A) Drive an automobile B) Make any legal decisions C) Drink any alcoholic beverage   2) You may resume regular meals tomorrow.  Today it is better to start with liquids and gradually work up to solid foods.  You may eat anything you prefer, but it is better to start with liquids, then soup and crackers, and gradually work up to solid foods.   3) Please notify your doctor immediately if you have any unusual bleeding, trouble breathing, redness and pain at the surgery site, drainage, fever, or pain not relieved by medication.    4) Additional Instructions:        Please contact your physician with any problems or Same Day Surgery at [phone], Monday through Friday 6 am to 4 pm, or [place] at [facility] Main number at [phone].     Interscalene Nerve Block, Care After This sheet gives you information about how to care for yourself after your procedure. Your health care provider may also give you more specific instructions. If you have problems or questions, contact your health care provider. What can I expect after the procedure? After the procedure, it is common to have:  Soreness or tenderness in your neck.  Numbness in your shoulder, upper arm, and some fingers.  Weakness in your shoulder and arm  muscles.  The feeling and strength in your shoulder, arm, and fingers should return to normal within hours after your procedure. Follow these instructions at home: For at least 24 hours after the procedure:  Do not: ? Participate in activities in which you could fall or become injured. ? Drive. ? Use heavy machinery. ? Drink alcohol. ? Take sleeping pills or medicines that cause drowsiness. ? Make important decisions or sign legal documents. ? Take care of children on your own.  Rest. Eating and drinking  If you vomit, drink water, juice, or soup when you can drink without vomiting.  Make sure you have little or no nausea before eating solid foods.  Follow the diet that is recommended by your health care provider. If you have a sling:  Wear it as told by your health care provider. Remove it only as told by your health care provider.  Loosen the sling if your fingers tingle, become numb, or turn cold and blue.  Make sure that your entire arm, including your wrist, is supported. Do not allow your wrist to dangle over the end of the sling.  Do not let your sling get wet if it is not waterproof.  Keep the sling clean. Bathing  Do not take baths, swim, or use a hot tub until your health care provider approves.  If you have a nerve block catheter in place, keep the incision site and tubing dry. Injection site care  Wash your hands with soap and water before you change your bandage (dressing). If soap  and water are not available, use hand sanitizer.  Change your dressing as told by your health care provider.  Keep your dressing dry.  Check your nerve block injection site every day for signs of infection. Check for: ? Redness, swelling, or pain. ? Fluid or blood. ? Warmth. Activity  Do not perform complex or risky activities while taking prescription pain medicine and until you have fully recovered.  Return to your normal activities as told by your health care provider  and as you can tolerate them. Ask your health care provider what activities are safe for you.  Rest and take it easy. This will help you heal and recover more quickly and fully.  Be very cautious until you have regained strength and sensation. General instructions  Have a responsible adult stay with you until you are awake and alert.  Do not drive or use heavy machinery while taking prescription pain medicine and until you have fully recovered. Ask your health care provider when it is safe to drive.  Take over-the-counter and prescription medicines only as told by your health care provider.  If you smoke, do not smoke without supervision.  Do not expose your arm or shoulder to very cold or very hot temperatures until you have full feeling back.  If you have a nerve block catheter in place: ? Try to keep the catheter from getting kinked or pinched. ? Avoid pulling or tugging on the catheter.  Keep all follow-up visits as told by your health care provider. This is important. Contact a health care provider if:  You have chills or fever.  You have redness, swelling, or pain around your injection site.  You have fluid or blood coming from the injection site.  The skin around the injection site is warm to the touch.  There is a bad smell coming from your dressing.  You have hoarseness or a drooping or dry eye that lasts more than a few days.  You have pain that is poorly controlled with the block or with pain medicine.  You have numbness, tingling, or weakness in your shoulder or arm that lasts for more than one week. Get help right away if:  You have severe pain.  You lose or do not regain strength and sensation in your arm even after the nerve block medicine has stopped.  You have trouble breathing.  You have a nerve block catheter still in place and you begin to shiver.  You have a nerve block catheter still in place and you are getting more and more numb or weak. This  information is not intended to replace advice given to you by your health care provider. Make sure you discuss any questions you have with your health care provider. Document Released: 05/18/2015 Document Revised: 01/25/2016 Document Reviewed: 01/25/2016 Elsevier Interactive Patient Education  [employer].

## 2018-04-29 NOTE — Anesthesia Procedure Notes (Signed)
Anesthesia Regional Block: Interscalene brachial plexus block   Pre-Anesthetic Checklist: ,, timeout performed, Correct Patient, Correct Site, Correct Laterality, Correct Procedure, Correct Position, site marked, Risks and benefits discussed,  Surgical consent,  Pre-op evaluation,  At surgeon's request and post-op pain management  Laterality: Right  Prep: chloraprep       Needles:  Injection technique: Single-shot  Needle Type: Stimiplex     Needle Length: 10cm  Needle Gauge: 21     Additional Needles:   Procedures:,,,, ultrasound used (permanent image in chart),,,,  Narrative:  Start time: 04/29/2018 9:42 AM End time: 04/29/2018 9:51 AM Injection made incrementally with aspirations every 5 mL.  Performed by: Personally  Anesthesiologist: Alver FisherPenwarden, Shykeria Sakamoto, MD  Additional Notes: Functioning IV was confirmed and monitors were applied.  A Stimuplex needle was used. Sterile prep and drape,hand hygiene and sterile gloves were used.  Negative aspiration and negative test dose prior to incremental administration of local anesthetic. The patient tolerated the procedure well.

## 2018-04-29 NOTE — H&P (Signed)
Paper H&P to be scanned into permanent record. H&P reviewed and patient re-examined. No changes. 

## 2018-04-29 NOTE — Anesthesia Postprocedure Evaluation (Signed)
Anesthesia Post Note  Patient: Michelle May  Procedure(s) Performed: SHOULDER ARTHROSCOPY WITH OPEN ROTATOR CUFF REPAIR (Right Shoulder)  Patient location during evaluation: PACU Anesthesia Type: General Level of consciousness: awake and alert and oriented Pain management: pain level controlled Vital Signs Assessment: post-procedure vital signs reviewed and stable Respiratory status: spontaneous breathing, nonlabored ventilation and respiratory function stable Cardiovascular status: blood pressure returned to baseline and stable Postop Assessment: no signs of nausea or vomiting Anesthetic complications: no     Last Vitals:  Vitals:   04/29/18 1444 04/29/18 1549  BP: 130/81 125/76  Pulse: 81 77  Resp: 16 16  Temp: (!) 36.3 C   SpO2: 97% 99%    Last Pain:  Vitals:   04/29/18 1444  TempSrc: Temporal  PainSc: 0-No pain                 Akshitha Culmer

## 2018-04-30 ENCOUNTER — Encounter: Payer: Self-pay | Admitting: Surgery

## 2018-05-26 ENCOUNTER — Ambulatory Visit: Payer: Self-pay

## 2018-08-04 ENCOUNTER — Encounter (INDEPENDENT_AMBULATORY_CARE_PROVIDER_SITE_OTHER): Payer: Self-pay

## 2018-08-04 ENCOUNTER — Ambulatory Visit: Payer: Self-pay | Attending: Oncology | Admitting: *Deleted

## 2018-08-04 ENCOUNTER — Ambulatory Visit
Admission: RE | Admit: 2018-08-04 | Discharge: 2018-08-04 | Disposition: A | Discharge status: Home / Self Care | Payer: Self-pay | Source: Ambulatory Visit | Attending: Internal Medicine | Admitting: Internal Medicine

## 2018-08-04 VITALS — BP 112/71 | HR 73 | Temp 98.3°F | Ht 65.0 in | Wt 148.7 lb

## 2018-08-04 DIAGNOSIS — Z0000 Encounter for general adult medical examination without abnormal findings: Secondary | ICD-10-CM

## 2018-08-04 DIAGNOSIS — Z1231 Encounter for screening mammogram for malignant neoplasm of breast: Secondary | ICD-10-CM

## 2018-08-04 NOTE — Patient Instructions (Signed)
Gave patient hand-out, Women Staying Healthy, Active and Well from [facility], with education on breast health, pap smears, heart and colon health. 
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
## 2018-08-13 ENCOUNTER — Encounter: Payer: Self-pay | Admitting: *Deleted

## 2018-08-13 NOTE — Progress Notes (Signed)
Letter mailed from the [facility] to inform patient of her normal mammogram results.  Patient is to follow-up with annual screening in one year.  HSIS to Christy. 

## 2018-09-14 ENCOUNTER — Encounter: Payer: Self-pay | Admitting: *Deleted

## 2018-09-14 ENCOUNTER — Ambulatory Visit
Admission: RE | Admit: 2018-09-14 | Discharge: 2018-09-14 | Disposition: A | Discharge status: Home / Self Care | Payer: Self-pay | Attending: Surgery | Admitting: Surgery

## 2018-09-14 ENCOUNTER — Encounter
Admission: RE | Disposition: A | Discharge status: Home / Self Care | Payer: Self-pay | Source: Home / Self Care | Attending: Surgery

## 2018-09-14 ENCOUNTER — Ambulatory Visit: Payer: Self-pay | Admitting: Anesthesiology

## 2018-09-14 ENCOUNTER — Other Ambulatory Visit: Payer: Self-pay

## 2018-09-14 DIAGNOSIS — Z791 Long term (current) use of non-steroidal anti-inflammatories (NSAID): Secondary | ICD-10-CM | POA: Insufficient documentation

## 2018-09-14 DIAGNOSIS — M7501 Adhesive capsulitis of right shoulder: Secondary | ICD-10-CM | POA: Insufficient documentation

## 2018-09-14 DIAGNOSIS — E119 Type 2 diabetes mellitus without complications: Secondary | ICD-10-CM | POA: Insufficient documentation

## 2018-09-14 DIAGNOSIS — Z7984 Long term (current) use of oral hypoglycemic drugs: Secondary | ICD-10-CM | POA: Insufficient documentation

## 2018-09-14 HISTORY — PX: EXAM UNDER ANESTHESIA WITH MANIPULATION OF SHOULDER: SHX5817

## 2018-09-14 LAB — POCT PREGNANCY, URINE: Preg Test, Ur: NEGATIVE

## 2018-09-14 LAB — GLUCOSE, CAPILLARY
Glucose-Capillary: 266 mg/dL — ABNORMAL HIGH (ref 70–99)
Glucose-Capillary: 216 mg/dL — ABNORMAL HIGH (ref 70–99)

## 2018-09-14 SURGERY — EXAM UNDER ANESTHESIA, SHOULDER, WITH MANIPULATION
Anesthesia: General | Laterality: Right

## 2018-09-14 MED ORDER — BUPIVACAINE-EPINEPHRINE 0.5% -1:200000 IJ SOLN: INTRAMUSCULAR | Status: DC | PRN

## 2018-09-14 MED ORDER — SODIUM CHLORIDE 0.9 % IV SOLN
INTRAVENOUS | Status: DC
  Administered 2018-09-14: 07:00:00 via INTRAVENOUS

## 2018-09-14 MED ORDER — ONDANSETRON HCL 4 MG/2ML IJ SOLN: 4.0000 mg | Freq: Once | INTRAMUSCULAR | Status: DC | PRN

## 2018-09-14 MED ORDER — FAMOTIDINE 20 MG PO TABS
20.0000 mg | ORAL_TABLET | Freq: Once | ORAL | Status: AC
  Administered 2018-09-14: 20 mg via ORAL

## 2018-09-14 MED ORDER — LIDOCAINE HCL (CARDIAC) PF 100 MG/5ML IV SOSY
PREFILLED_SYRINGE | INTRAVENOUS | Status: DC | PRN
  Administered 2018-09-14: 100 mg via INTRATRACHEAL

## 2018-09-14 MED ORDER — OXYCODONE HCL 5 MG PO TABS: 5.0000 mg | ORAL_TABLET | ORAL | Status: DC | PRN

## 2018-09-14 MED ORDER — MIDAZOLAM HCL 2 MG/2ML IJ SOLN
INTRAMUSCULAR | Status: DC | PRN
  Administered 2018-09-14: 2 mg via INTRAVENOUS

## 2018-09-14 MED ORDER — METOCLOPRAMIDE HCL 10 MG PO TABS: 5.0000 mg | ORAL_TABLET | Freq: Three times a day (TID) | ORAL | Status: DC | PRN

## 2018-09-14 MED ORDER — TRIAMCINOLONE ACETONIDE 40 MG/ML IJ SUSP
INTRAMUSCULAR | Status: DC | PRN
  Administered 2018-09-14: 1 mL via INTRA_ARTICULAR

## 2018-09-14 MED ORDER — MIDAZOLAM HCL 2 MG/2ML IJ SOLN
INTRAMUSCULAR | Status: AC
  Filled 2018-09-14: qty 2

## 2018-09-14 MED ORDER — PROPOFOL 10 MG/ML IV BOLUS
INTRAVENOUS | Status: DC | PRN
  Administered 2018-09-14: 70 mg via INTRAVENOUS

## 2018-09-14 MED ORDER — FENTANYL CITRATE (PF) 100 MCG/2ML IJ SOLN
INTRAMUSCULAR | Status: AC
  Filled 2018-09-14: qty 2

## 2018-09-14 MED ORDER — PROPOFOL 10 MG/ML IV BOLUS
INTRAVENOUS | Status: AC
  Filled 2018-09-14: qty 20

## 2018-09-14 MED ORDER — ONDANSETRON HCL 4 MG PO TABS: 4.0000 mg | ORAL_TABLET | Freq: Four times a day (QID) | ORAL | Status: DC | PRN

## 2018-09-14 MED ORDER — METOCLOPRAMIDE HCL 5 MG/ML IJ SOLN: 5.0000 mg | Freq: Three times a day (TID) | INTRAMUSCULAR | Status: DC | PRN

## 2018-09-14 MED ORDER — ONDANSETRON HCL 4 MG/2ML IJ SOLN: 4.0000 mg | Freq: Four times a day (QID) | INTRAMUSCULAR | Status: DC | PRN

## 2018-09-14 MED ORDER — POTASSIUM CHLORIDE IN NACL 20-0.9 MEQ/L-% IV SOLN: INTRAVENOUS | Status: DC

## 2018-09-14 MED ORDER — FENTANYL CITRATE (PF) 100 MCG/2ML IJ SOLN
25.0000 ug | INTRAMUSCULAR | Status: DC | PRN
  Administered 2018-09-14 (×4): 25 ug via INTRAVENOUS

## 2018-09-14 MED ORDER — TRIAMCINOLONE ACETONIDE 40 MG/ML IJ SUSP
INTRAMUSCULAR | Status: AC
  Filled 2018-09-14: qty 1

## 2018-09-14 MED ORDER — BUPIVACAINE-EPINEPHRINE (PF) 0.25% -1:200000 IJ SOLN
INTRAMUSCULAR | Status: DC | PRN
  Administered 2018-09-14: 9 mL

## 2018-09-14 MED ORDER — OXYCODONE HCL 5 MG PO TABS: 5.0000 mg | ORAL_TABLET | ORAL | 0 refills | Status: AC | PRN

## 2018-09-14 MED ORDER — FENTANYL CITRATE (PF) 100 MCG/2ML IJ SOLN
INTRAMUSCULAR | Status: AC
  Administered 2018-09-14: 25 ug via INTRAVENOUS
  Filled 2018-09-14: qty 2

## 2018-09-14 MED ORDER — FAMOTIDINE 20 MG PO TABS
ORAL_TABLET | ORAL | Status: AC
  Filled 2018-09-14: qty 1

## 2018-09-14 MED ORDER — BUPIVACAINE-EPINEPHRINE (PF) 0.25% -1:200000 IJ SOLN
INTRAMUSCULAR | Status: AC
  Filled 2018-09-14: qty 30

## 2018-09-14 SURGICAL SUPPLY — 6 items
BNDG ADH 2 X3.75 FABRIC TAN LF (GAUZE/BANDAGES/DRESSINGS) ×3 IMPLANT
BNDG ADH XL 3.75X2 STRCH LF (GAUZE/BANDAGES/DRESSINGS) ×1
NDL HYPO 21X1.5 SAFETY (NEEDLE) ×1 IMPLANT
NEEDLE HYPO 21X1.5 SAFETY (NEEDLE) ×3 IMPLANT
PAD ALCOHOL SWAB (MISCELLANEOUS) ×6 IMPLANT
SYR 10ML LL (SYRINGE) ×3 IMPLANT

## 2018-09-14 NOTE — Op Note (Signed)
09/14/2018  9:05 AM  Patient:   Michelle May  Pre-Op Diagnosis:   Secondary adhesive capsulitis, right shoulder.  Post-Op Diagnosis:   Same  Procedure:   Manipulation under anesthesia with steroid injection, right shoulder.  Surgeon:   Maryagnes Amos, MD  Assistant:   None  Anesthesia:   IV sedation  Findings:   As above. Prior to manipulation, the right shoulder could be forward flexed to 130 and abducted to 125. At 90 of abduction, the shoulder could be externally rotated to 65 and internally rotated to 45. Following manipulation, the shoulder could be forward flexed to 170, abducted to 165 and, at 90 of abduction, externally rotated to 95 and internally rotated to 75.  Complications:   None  EBL:   0 cc  Fluids:   400 cc crystalloid  TT:   None  Drains:   None  Closure:   None  Brief Clinical Note:   The patient is a 53 year old female who is now 4.5 months status post a right shoulder arthroscopy with debridement, decompression, and rotator cuff repair. Despite extensive physical therapy, the patient continues to have difficulty regaining shoulder range of motion. The patient's history and examination are consistent with adhesive capsulitis. The patient presents at this time for a manipulation under anesthesia with steroid injection of the right shoulder.  Procedure:   The patient was brought into the operating room and lain in the supine position. After adequate IV sedation was achieved, a timeout was performed to verify the correct surgical site. The right shoulder was gently manipulated in both abduction and external rotation, as well as adduction and internal rotation. Several palpable and audible pops were heard as the scar tissue released, permitting full range of motion of the shoulder. The glenohumeral joint was injected sterilely using 1 cc of Kenalog-40 and 9 cc of 0.25% Sensorcaine with epinephrine before the patient was awakened and returned to  the recovery room in satisfactory condition after tolerating the procedure well.

## 2018-09-14 NOTE — Transfer of Care (Signed)
Immediate Anesthesia Transfer of Care Note  Patient: Michelle May  Procedure(s) Performed: EXAM UNDER ANESTHESIA WITH MANIPULATION OF SHOULDER, STEROID INJECTION OF RIGHT SHOULDER - DIABETIC (Right )  Patient Location: PACU  Anesthesia Type:General  Level of Consciousness: drowsy  Airway & Oxygen Therapy: Patient connected to face mask oxygen  Post-op Assessment: Post -op Vital signs reviewed and stable  Post vital signs: stable  Last Vitals:  Vitals Value Taken Time  BP 124/65 09/14/2018  9:11 AM  Temp    Pulse 74 09/14/2018  9:12 AM  Resp    SpO2 100 % 09/14/2018  9:12 AM  Vitals shown include unvalidated device data.  Last Pain:  Vitals:   09/14/18 0702  TempSrc: Temporal  PainSc: 4          Complications: No apparent anesthesia complications

## 2018-09-14 NOTE — Anesthesia Postprocedure Evaluation (Signed)
Anesthesia Post Note  Patient: Niralya E Moroyoqui-Alamea  Procedure(s) Performed: EXAM UNDER ANESTHESIA WITH MANIPULATION OF SHOULDER, STEROID INJECTION OF RIGHT SHOULDER - DIABETIC (Right )  Patient location during evaluation: PACU Anesthesia Type: General Level of consciousness: awake and alert and oriented Pain management: pain level controlled Vital Signs Assessment: post-procedure vital signs reviewed and stable Respiratory status: spontaneous breathing, nonlabored ventilation and respiratory function stable Cardiovascular status: blood pressure returned to baseline and stable Postop Assessment: no signs of nausea or vomiting Anesthetic complications: no     Last Vitals:  Vitals:   09/14/18 1007 09/14/18 1040  BP: 130/77 132/84  Pulse: 66 65  Resp: 18 16  Temp: 36.7 C   SpO2: 99% 99%    Last Pain:  Vitals:   09/14/18 1040  TempSrc:   PainSc: 2                  Emonie Espericueta

## 2018-09-14 NOTE — Progress Notes (Signed)
Pt personal belongings retrieved from locker #3 in post post. Belongings were locked during her stay. Pt states everything has been returned to her including purse, contents, and cell phone.

## 2018-09-14 NOTE — Anesthesia Procedure Notes (Signed)
Procedure Name: MAC Date/Time: 09/14/2018 8:56 AM Performed by: Aline Brochure, CRNA Pre-anesthesia Checklist: Patient identified, Emergency Drugs available, Suction available and Patient being monitored Patient Re-evaluated:Patient Re-evaluated prior to induction Oxygen Delivery Method: Simple face mask

## 2018-09-14 NOTE — H&P (Signed)
Paper H&P to be scanned into permanent record. H&P reviewed and patient re-examined. No changes. 

## 2018-09-14 NOTE — Anesthesia Preprocedure Evaluation (Addendum)
Anesthesia Evaluation  Patient identified by MRN, date of birth, ID band Patient awake    Reviewed: Allergy & Precautions, NPO status , Patient's Chart, lab work & pertinent test results  History of Anesthesia Complications Negative for: history of anesthetic complications  Airway Mallampati: II  TM Distance: >3 FB Neck ROM: Full    Dental  (+) Poor Dentition, Missing, Loose   Pulmonary neg pulmonary ROS, neg sleep apnea, neg COPD,    breath sounds clear to auscultation- rhonchi (-) wheezing      Cardiovascular Exercise Tolerance: Good (-) hypertension(-) CAD, (-) Past MI, (-) Cardiac Stents and (-) CABG  Rhythm:Regular Rate:Normal - Systolic murmurs and - Diastolic murmurs    Neuro/Psych Anxiety negative neurological ROS     GI/Hepatic negative GI ROS, Neg liver ROS,   Endo/Other  diabetes, Oral Hypoglycemic Agents  Renal/GU negative Renal ROS     Musculoskeletal negative musculoskeletal ROS (+)   Abdominal (+) - obese,   Peds  Hematology negative hematology ROS (+)   Anesthesia Other Findings Past Medical History: No date: Anxiety No date: Diabetes mellitus without complication (HCC) No date: Pain in left leg No date: Shoulder pain   Reproductive/Obstetrics                             Anesthesia Physical Anesthesia Plan  ASA: II  Anesthesia Plan: General   Post-op Pain Management:    Induction: Intravenous  PONV Risk Score and Plan: 2  Airway Management Planned: Natural Airway  Additional Equipment:   Intra-op Plan:   Post-operative Plan:   Informed Consent: I have reviewed the patients History and Physical, chart, labs and discussed the procedure including the risks, benefits and alternatives for the proposed anesthesia with the patient or authorized representative who has indicated his/her understanding and acceptance.     Dental advisory given  Plan Discussed  with: CRNA and Anesthesiologist  Anesthesia Plan Comments:        Anesthesia Quick Evaluation

## 2018-09-14 NOTE — Discharge Instructions (Addendum)
AMBULATORY SURGERY  DISCHARGE INSTRUCTIONS   1) The drugs that you were given will stay in your system until tomorrow so for the next 24 hours you should not:  A) Drive an automobile B) Make any legal decisions C) Drink any alcoholic beverage   2) You may resume regular meals tomorrow.  Today it is better to start with liquids and gradually work up to solid foods.  You may eat anything you prefer, but it is better to start with liquids, then soup and crackers, and gradually work up to solid foods.   3) Please notify your doctor immediately if you have any unusual bleeding, trouble breathing, redness and pain at the surgery site, drainage, fever, or pain not relieved by medication.    4) Additional Instructions:        Please contact your physician with any problems or Same Day Surgery at [phone], Monday through Friday 6 am to 4 pm, or [place] at [facility] number at [phone].   Orthopedic discharge instructions: May shower beginning tomorrow. Apply ice frequently to shoulder. Take ibuprofen 600 mg TID with meals for 7-10 days, then as necessary. Take oxycodone as prescribed when needed.  May supplement with ES Tylenol if necessary. Use sling only as necessary. Start Physical Therapy tomorrow as scheduled. Follow-up in 10-14 days or as scheduled.

## 2018-09-14 NOTE — Anesthesia Post-op Follow-up Note (Signed)
Anesthesia QCDR form completed.        

## 2020-06-20 ENCOUNTER — Other Ambulatory Visit: Payer: Self-pay

## 2020-06-20 DIAGNOSIS — Z20822 Contact with and (suspected) exposure to covid-19: Secondary | ICD-10-CM

## 2020-06-22 LAB — SARS-COV-2, NAA 2 DAY TAT

## 2020-06-22 LAB — NOVEL CORONAVIRUS, NAA: SARS-CoV-2, NAA: NOT DETECTED

## 2020-08-14 ENCOUNTER — Other Ambulatory Visit: Payer: Self-pay | Admitting: Internal Medicine

## 2020-08-14 DIAGNOSIS — Z1231 Encounter for screening mammogram for malignant neoplasm of breast: Secondary | ICD-10-CM

## 2020-08-21 IMAGING — MG DIGITAL SCREENING BILATERAL MAMMOGRAM WITH TOMO AND CAD
8 series · 8 of 24 positions shown · non-contrast
Comparison: Previous exam(s).

CLINICAL DATA: Screening.

EXAM:
DIGITAL SCREENING BILATERAL MAMMOGRAM WITH TOMO AND CAD

[L MLO synth-2D]
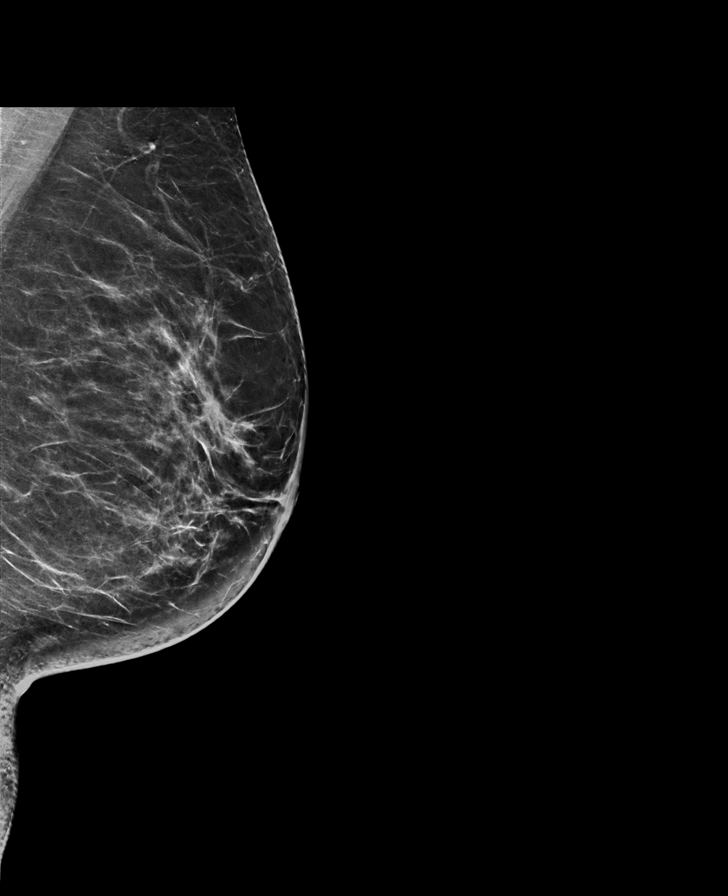

[R MLO synth-2D]
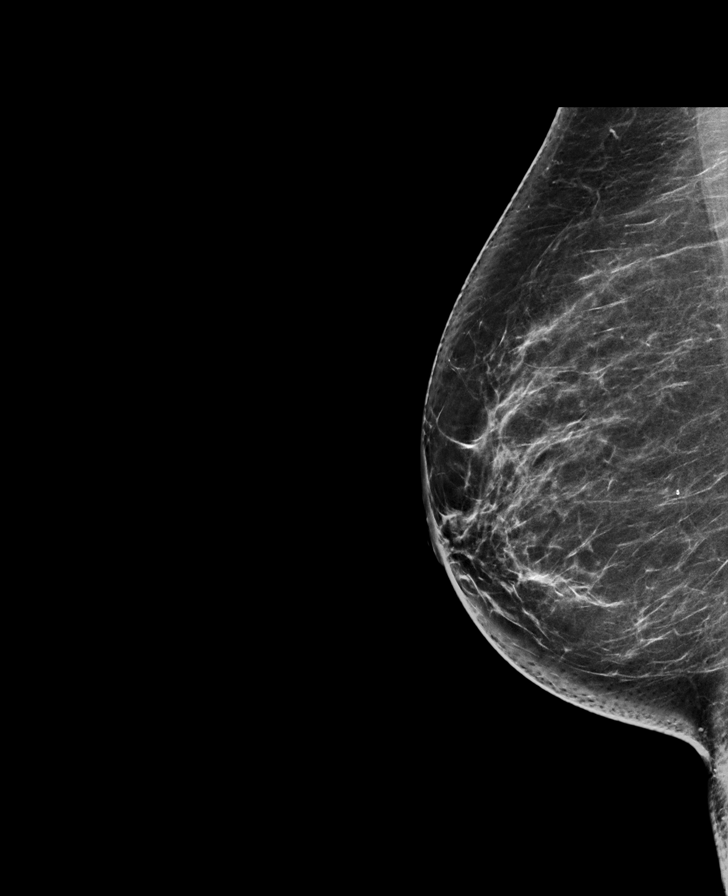

[R CC synth-2D]
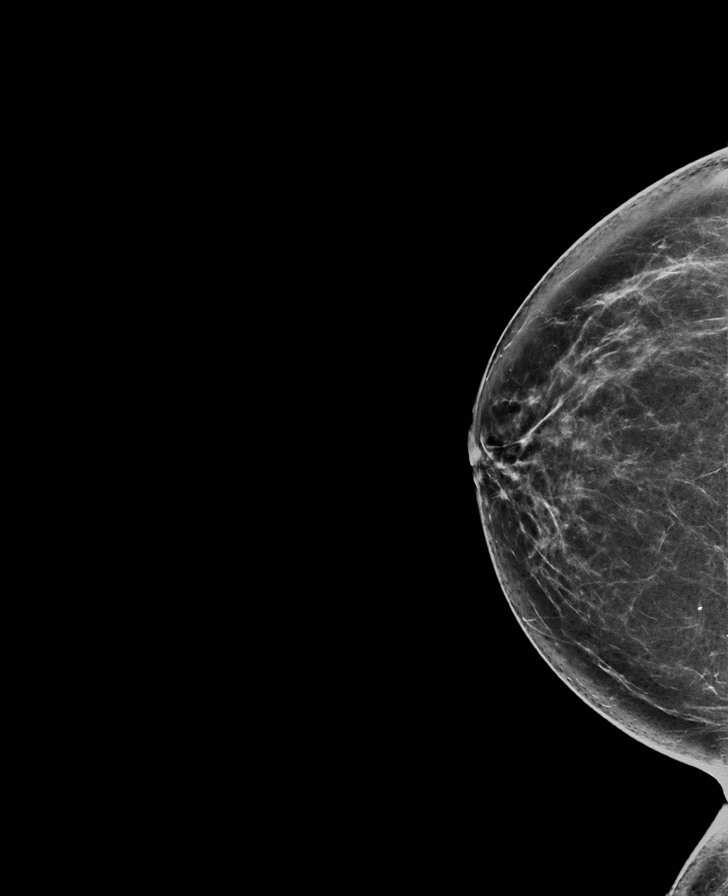

[L CC synth-2D]
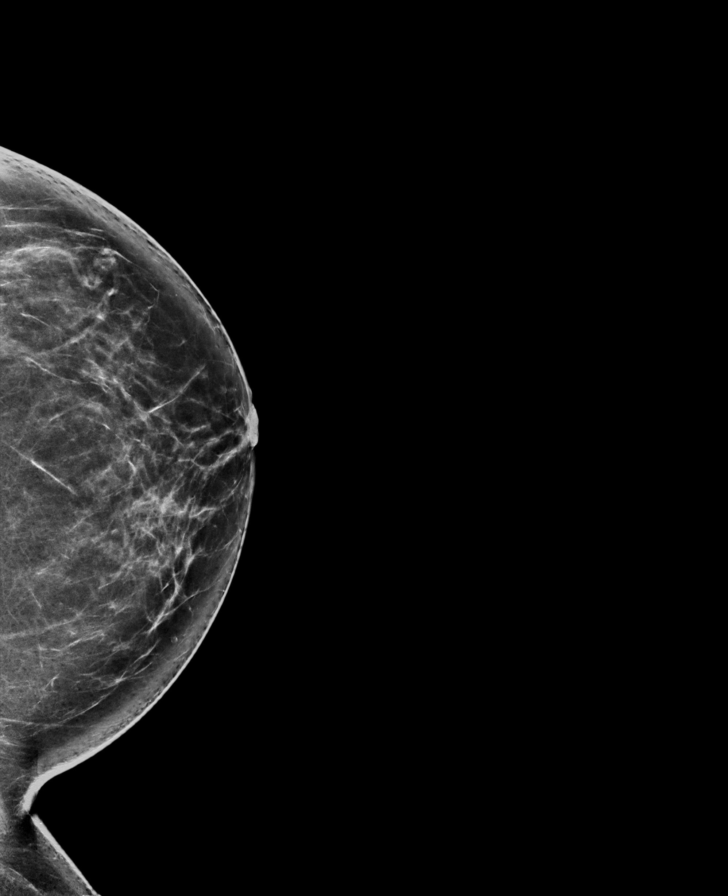

[L CC tomo · tomo slice 41/82.0]
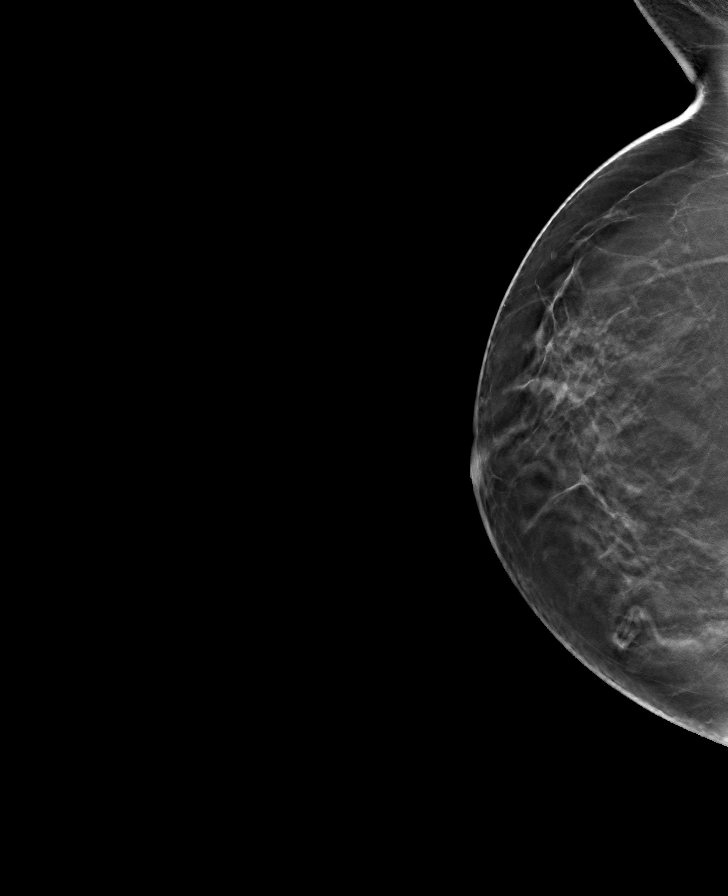

[R MLO tomo · tomo slice 41/81.0]
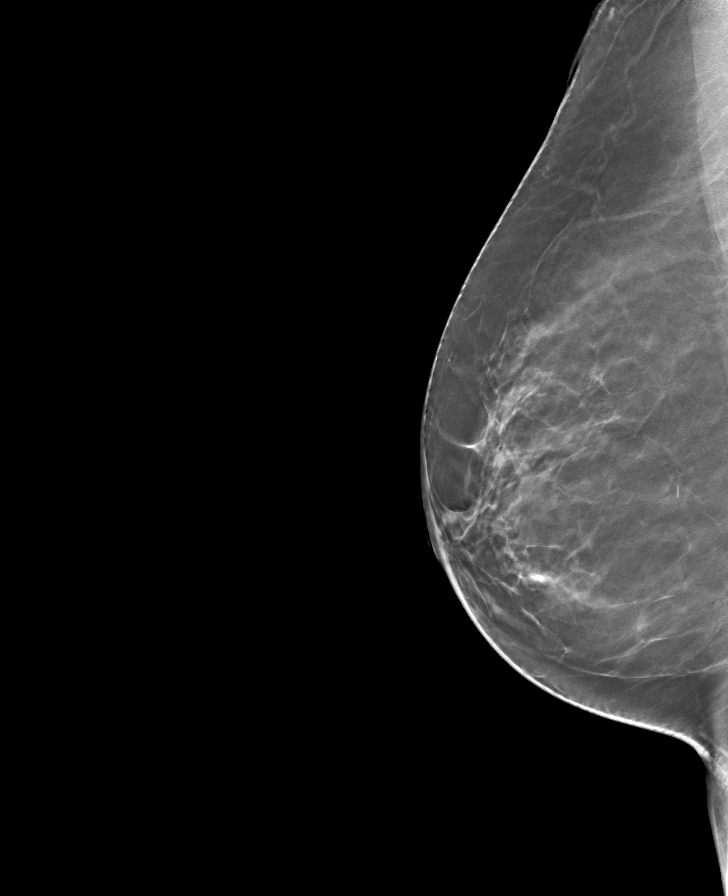

[L MLO tomo · tomo slice 41/80.0]
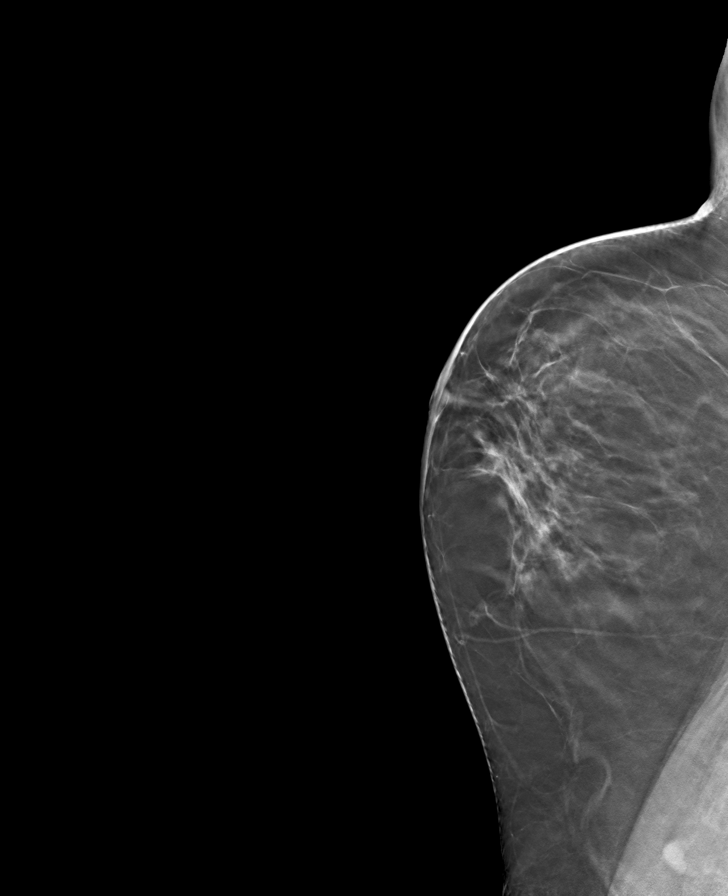

[R CC tomo · tomo slice 40/79.0]
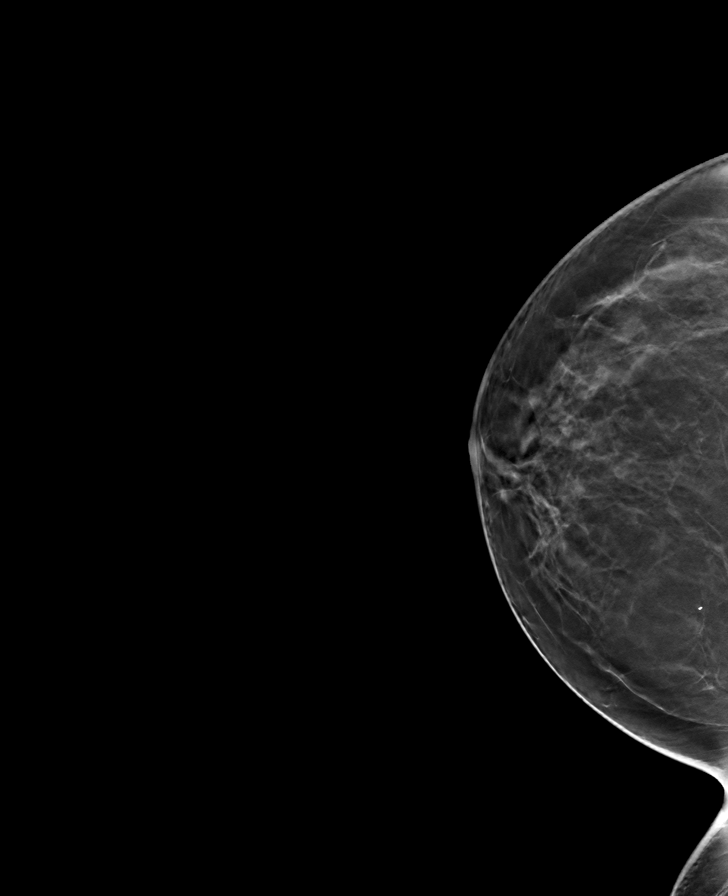

[8 of 24 positions shown; findings below may reference images not displayed]

ACR Breast Density Category b: There are scattered areas of
fibroglandular density.
FINDINGS: There are no findings suspicious for malignancy. Images were
processed with CAD.
IMPRESSION: No mammographic evidence of malignancy. A result letter of this
screening mammogram will be mailed directly to the patient.

RECOMMENDATION:
Screening mammogram in one year. (Code:[id number])

BI-RADS CATEGORY  1: Negative.

## 2022-03-26 ENCOUNTER — Other Ambulatory Visit: Payer: Self-pay | Admitting: Student

## 2022-03-26 DIAGNOSIS — G8929 Other chronic pain: Secondary | ICD-10-CM

## 2022-03-26 DIAGNOSIS — M5416 Radiculopathy, lumbar region: Secondary | ICD-10-CM

## 2022-03-26 DIAGNOSIS — M4807 Spinal stenosis, lumbosacral region: Secondary | ICD-10-CM

## 2022-03-26 DIAGNOSIS — M47816 Spondylosis without myelopathy or radiculopathy, lumbar region: Secondary | ICD-10-CM

## 2022-04-05 ENCOUNTER — Encounter: Payer: Self-pay | Admitting: Emergency Medicine

## 2022-04-05 ENCOUNTER — Emergency Department
Admission: EM | Admit: 2022-04-05 | Discharge: 2022-04-05 | Disposition: A | Discharge status: Home / Self Care | Payer: No Typology Code available for payment source | Attending: Emergency Medicine | Admitting: Emergency Medicine

## 2022-04-05 ENCOUNTER — Emergency Department: Payer: No Typology Code available for payment source

## 2022-04-05 ENCOUNTER — Other Ambulatory Visit: Payer: Self-pay

## 2022-04-05 DIAGNOSIS — S40021A Contusion of right upper arm, initial encounter: Secondary | ICD-10-CM | POA: Diagnosis not present

## 2022-04-05 DIAGNOSIS — Y92410 Unspecified street and highway as the place of occurrence of the external cause: Secondary | ICD-10-CM | POA: Insufficient documentation

## 2022-04-05 DIAGNOSIS — E1165 Type 2 diabetes mellitus with hyperglycemia: Secondary | ICD-10-CM | POA: Insufficient documentation

## 2022-04-05 DIAGNOSIS — S0990XA Unspecified injury of head, initial encounter: Secondary | ICD-10-CM | POA: Diagnosis not present

## 2022-04-05 DIAGNOSIS — S4991XA Unspecified injury of right shoulder and upper arm, initial encounter: Secondary | ICD-10-CM | POA: Diagnosis present

## 2022-04-05 LAB — CBG MONITORING, ED
Glucose-Capillary: 360 mg/dL — ABNORMAL HIGH (ref 70–99)
Glucose-Capillary: 338 mg/dL — ABNORMAL HIGH (ref 70–99)

## 2022-04-05 MED ORDER — MELOXICAM 15 MG PO TABS: 15.0000 mg | ORAL_TABLET | Freq: Every day | ORAL | 0 refills | Status: AC

## 2022-04-05 MED ORDER — METHOCARBAMOL 500 MG PO TABS: 500.0000 mg | ORAL_TABLET | Freq: Four times a day (QID) | ORAL | 0 refills | Status: AC

## 2022-04-05 NOTE — ED Notes (Signed)
DC instructions given verbally and in writing, understanding voiced, signature obtained and pt left in stable condition with spouse.

## 2022-04-05 NOTE — ED Notes (Signed)
Called x 1 no answer
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
## 2022-05-28 DIAGNOSIS — R030 Elevated blood-pressure reading, without diagnosis of hypertension: Secondary | ICD-10-CM | POA: Diagnosis not present

## 2022-05-28 DIAGNOSIS — Z7984 Long term (current) use of oral hypoglycemic drugs: Secondary | ICD-10-CM | POA: Diagnosis not present

## 2022-05-28 DIAGNOSIS — E119 Type 2 diabetes mellitus without complications: Secondary | ICD-10-CM | POA: Diagnosis not present

## 2022-05-28 DIAGNOSIS — M1990 Unspecified osteoarthritis, unspecified site: Secondary | ICD-10-CM | POA: Diagnosis not present

## 2022-08-05 ENCOUNTER — Other Ambulatory Visit: Payer: Self-pay | Admitting: Internal Medicine

## 2022-08-05 DIAGNOSIS — Z1231 Encounter for screening mammogram for malignant neoplasm of breast: Secondary | ICD-10-CM

## 2022-08-22 ENCOUNTER — Encounter: Payer: Self-pay | Admitting: Student

## 2022-10-24 ENCOUNTER — Other Ambulatory Visit: Payer: Self-pay

## 2022-10-24 DIAGNOSIS — Z1231 Encounter for screening mammogram for malignant neoplasm of breast: Secondary | ICD-10-CM

## 2022-12-15 ENCOUNTER — Ambulatory Visit: Payer: Self-pay | Attending: Hematology and Oncology | Admitting: Hematology and Oncology

## 2022-12-15 ENCOUNTER — Ambulatory Visit
Admission: RE | Admit: 2022-12-15 | Discharge: 2022-12-15 | Disposition: A | Discharge status: Home / Self Care | Payer: Self-pay | Source: Ambulatory Visit | Attending: Obstetrics and Gynecology | Admitting: Obstetrics and Gynecology

## 2022-12-15 VITALS — Wt 133.0 lb

## 2022-12-15 DIAGNOSIS — Z01419 Encounter for gynecological examination (general) (routine) without abnormal findings: Secondary | ICD-10-CM

## 2022-12-15 DIAGNOSIS — Z1211 Encounter for screening for malignant neoplasm of colon: Secondary | ICD-10-CM

## 2022-12-15 DIAGNOSIS — Z124 Encounter for screening for malignant neoplasm of cervix: Secondary | ICD-10-CM

## 2022-12-15 DIAGNOSIS — Z1231 Encounter for screening mammogram for malignant neoplasm of breast: Secondary | ICD-10-CM | POA: Insufficient documentation

## 2022-12-15 NOTE — Progress Notes (Signed)
Michelle May is a 57 y.o. No obstetric history on file. female who presents to [facility] clinic today with no complaints.    Pap Smear: Pap smear completed today. Last Pap smear was 09/10/2016 at [facility] clinic and was normal. Per patient has no history of an abnormal Pap smear. Last Pap smear result is available in Epic.   Physical exam: Breasts Breasts symmetrical. No skin abnormalities bilateral breasts. No nipple retraction bilateral breasts. No nipple discharge bilateral breasts. No lymphadenopathy. No lumps palpated bilateral breasts.  MM 3D SCREEN BREAST BILATERAL  Result Date: 08/04/2018 CLINICAL DATA:  Screening. EXAM: DIGITAL SCREENING BILATERAL MAMMOGRAM WITH TOMO AND CAD COMPARISON:  Previous exam(s). ACR Breast Density Category b: There are scattered areas of fibroglandular density. FINDINGS: There are no findings suspicious for malignancy. Images were processed with CAD. IMPRESSION: No mammographic evidence of malignancy. A result letter of this screening mammogram will be mailed directly to the patient. RECOMMENDATION: Screening mammogram in one year. (Code:[id number]) BI-RADS CATEGORY  1: Negative. Electronically Signed   By: Baird Lyons M.D.   On: 08/04/2018 12:16         Pelvic/Bimanual Ext Genitalia No lesions, no swelling and no discharge observed on external genitalia.        Vagina Vagina pink and normal texture. No lesions or discharge observed in vagina.        Cervix Cervix is present. Cervix pink and of normal texture. No discharge observed.    Uterus Uterus is present and palpable. Uterus in normal position and normal size.        Adnexae Bilateral ovaries present and palpable. No tenderness on palpation.         Rectovaginal No rectal exam completed today since patient had no rectal complaints. No skin abnormalities observed on exam.     Smoking History: Patient has never smoked and was not referred to quit line.    Patient  Navigation: Patient education provided. Access to services provided for patient through [facility] program. [employer] Wynona Canes [phone]) interpreter provided. No transportation provided   Colorectal Cancer Screening: Per patient has never had colonoscopy completed No complaints today.    Breast and Cervical Cancer Risk Assessment: Patient does not have family history of breast cancer, known genetic mutations, or radiation treatment to the chest before age 37. Patient does not have history of cervical dysplasia, immunocompromised, or DES exposure in-utero.  Risk Assessment   No risk assessment data for the current encounter  Risk Scores       08/04/2018   Last edited by: Alta Corning, CMA   5-year risk: 0.6 %   Lifetime risk: 5 %            A: [facility] exam with pap smear No complaints with benign exam.   P: Referred patient to [facility] for a screening mammogram. Appointment scheduled 12/15/2022.  Ilda Basset A, NP 12/15/2022 11:20 AM

## 2022-12-15 NOTE — Patient Instructions (Signed)
Taught Michelle May about self breast awareness and gave educational materials to take home. Patient did not need a Pap smear today due to last Pap smear was in 09/10/2016 per patient. Let her know [facility] will cover Pap smears every 5 years unless has a history of abnormal Pap smears. Referred patient to the [facility] for screening mammogram. Appointment scheduled for 12/15/2022. Patient aware of appointment and will be there. Let patient know will follow up with her within the next couple weeks with results. Michelle May verbalized understanding.  Pascal Lux, NP 11:22 AM

## 2022-12-18 LAB — CYTOLOGY - PAP
Comment: NEGATIVE
Diagnosis: UNDETERMINED — AB
High risk HPV: NEGATIVE

## 2022-12-22 ENCOUNTER — Telehealth: Payer: Self-pay

## 2022-12-22 NOTE — Telephone Encounter (Signed)
Called patient via [place] [place], [employer] to give pap smear results. Informed patient that pap smear was ASCUS and HPV was negative. Based on this result her next pap smear will be due in 3 years. Patient voiced understanding.

## 2022-12-24 ENCOUNTER — Telehealth: Payer: Self-pay

## 2022-12-24 LAB — FECAL OCCULT BLOOD, IMMUNOCHEMICAL: Fecal Occult Bld: NEGATIVE

## 2022-12-24 NOTE — Telephone Encounter (Signed)
Normal FIT Test results mailed.
